# Patient Record
Sex: Female | Born: 1975 | Race: White | Hispanic: No | Marital: Married | Smoking: Never smoker
Health system: Southern US, Community
[De-identification: ages and names within clinical notes are randomized; demographics above are authoritative.]

## PROBLEM LIST (undated history)

## (undated) DIAGNOSIS — Z8614 Personal history of Methicillin resistant Staphylococcus aureus infection: Secondary | ICD-10-CM

## (undated) DIAGNOSIS — R630 Anorexia: Secondary | ICD-10-CM

## (undated) HISTORY — DX: Personal history of Methicillin resistant Staphylococcus aureus infection: Z86.14

## (undated) HISTORY — DX: Anorexia: R63.0

---

## 2009-02-23 DIAGNOSIS — M810 Age-related osteoporosis without current pathological fracture: Secondary | ICD-10-CM | POA: Insufficient documentation

## 2010-03-10 ENCOUNTER — Ambulatory Visit: Payer: Self-pay | Admitting: Obstetrics & Gynecology

## 2010-03-12 ENCOUNTER — Ambulatory Visit: Payer: Self-pay | Admitting: Family

## 2010-03-12 ENCOUNTER — Encounter: Payer: Self-pay | Admitting: Obstetrics and Gynecology

## 2010-03-12 ENCOUNTER — Ambulatory Visit (HOSPITAL_BASED_OUTPATIENT_CLINIC_OR_DEPARTMENT_OTHER): Admission: RE | Admit: 2010-03-12 | Discharge: 2010-03-12 | Payer: Self-pay | Admitting: Obstetrics & Gynecology

## 2010-03-12 ENCOUNTER — Ambulatory Visit: Payer: Self-pay | Admitting: Diagnostic Radiology

## 2010-03-12 LAB — CONVERTED CEMR LAB: hCG, Beta Chain, Quant, S: 18437 milliintl units/mL

## 2010-03-15 ENCOUNTER — Encounter: Payer: Self-pay | Admitting: Obstetrics and Gynecology

## 2010-03-15 LAB — CONVERTED CEMR LAB: hCG, Beta Chain, Quant, S: 22002 milliintl units/mL

## 2010-03-22 ENCOUNTER — Encounter: Payer: Self-pay | Admitting: Obstetrics and Gynecology

## 2010-03-22 ENCOUNTER — Ambulatory Visit: Payer: Self-pay | Admitting: Diagnostic Radiology

## 2010-03-22 ENCOUNTER — Ambulatory Visit (HOSPITAL_BASED_OUTPATIENT_CLINIC_OR_DEPARTMENT_OTHER): Admission: RE | Admit: 2010-03-22 | Discharge: 2010-03-22 | Payer: Self-pay | Admitting: Obstetrics & Gynecology

## 2010-03-22 LAB — CONVERTED CEMR LAB
Platelets: 207 10*3/uL (ref 150–400)
Rh Type: POSITIVE
WBC: 7.1 10*3/uL (ref 4.0–10.5)
hCG, Beta Chain, Quant, S: 36850.3 milliintl units/mL

## 2010-04-12 ENCOUNTER — Ambulatory Visit: Payer: Self-pay | Admitting: Diagnostic Radiology

## 2010-04-12 ENCOUNTER — Ambulatory Visit (HOSPITAL_BASED_OUTPATIENT_CLINIC_OR_DEPARTMENT_OTHER): Admission: RE | Admit: 2010-04-12 | Discharge: 2010-04-12 | Payer: Self-pay | Admitting: Obstetrics & Gynecology

## 2010-04-13 ENCOUNTER — Encounter: Payer: Self-pay | Admitting: Obstetrics and Gynecology

## 2010-04-26 ENCOUNTER — Encounter: Payer: Self-pay | Admitting: Obstetrics and Gynecology

## 2010-04-26 LAB — CONVERTED CEMR LAB: hCG, Beta Chain, Quant, S: 408.1 milliintl units/mL

## 2010-06-19 ENCOUNTER — Encounter: Payer: Self-pay | Admitting: Obstetrics & Gynecology

## 2010-08-04 ENCOUNTER — Encounter: Payer: Self-pay | Admitting: Obstetrics & Gynecology

## 2010-08-04 ENCOUNTER — Other Ambulatory Visit: Payer: Self-pay | Admitting: Obstetrics & Gynecology

## 2010-08-04 ENCOUNTER — Ambulatory Visit (INDEPENDENT_AMBULATORY_CARE_PROVIDER_SITE_OTHER): Payer: PRIVATE HEALTH INSURANCE | Admitting: Obstetrics & Gynecology

## 2010-08-04 DIAGNOSIS — N939 Abnormal uterine and vaginal bleeding, unspecified: Secondary | ICD-10-CM

## 2010-08-04 DIAGNOSIS — N926 Irregular menstruation, unspecified: Secondary | ICD-10-CM

## 2010-08-04 LAB — CONVERTED CEMR LAB: TSH: 2.181 microintl units/mL (ref 0.350–4.500)

## 2010-08-20 NOTE — Assessment & Plan Note (Signed)
Erika Garza, Erika Garza              ACCOUNT NO.:  [id number]  MEDICAL RECORD NO.:  [id number]           PATIENT TYPE:  LOCATION:  [facility]           FACILITY:  PHYSICIAN:  Elsie Lincoln, MD      DATE OF BIRTH:  01/18/1976  DATE OF SERVICE:  08/04/2010                                 CLINIC NOTE  The patient is a 35 year old female who presents for her uterine bleeding.  The patient had a diagnosis of a missed abortion back in November.  She took Cytotec, but never really passed the fetus after the Cytotec.  She then chose expectant management.  She had a positive pregnancy test in January and had continuous random bleeding.  The bleeding has continued and gotten worse through February.  It is unclear whether she has ever really passed the first pregnancy and got pregnant again or this is a continuation of bleeding from the first pregnancy. We will never know the answer to this question, but the patient does not seem concerned with this.  We just need to figure out why she is bleeding.  The patient has never really had documented normal periods, but she did not have problems getting pregnant.  She has had a history of anorexia, and did drop her periods at some point during her life, but she was on birth control for most part, and did bleed when she withdrew from the placebo pills.  She is in need of a Pap smear.  We will do that when she is no longer bleeding.  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  SURGICAL HISTORY:  C-section.  OBSTETRICAL HISTORY:  C-section and VBAC.  FAMILY HISTORY:  Hypothyroidism, hypertension, bladder cancer, and her paternal grandmother with depression.  The patient's medical history is also significant for osteoporosis.  PHYSICAL EXAMINATION:  VITAL SIGNS:  Pulse 86, blood pressure 123/89, weight 109, height is 66 inches. GENERAL:  Well nourished, well developed, in no  apparent distress, slightly underweight. ABDOMEN:  Soft, nontender. GENITALIA:  Tanner V with stains of blood.  Vagina pink, normal rugae with products of conception at the edge of os hanging into the vagina. These were easily removed.  Os is fingertip to 1, internal os is closed, external os is fingertip to 1, internal os is closed.  Uterus is mobile. Uterus is midline.  Uterus is slightly tender to deep palpation. Ultrasound shows a normal uterus with a thin lining.  Both ovaries are normal with small follicles.  ASSESSMENT AND PLAN:  A 35 year old female with products of conception involved at the edge of the cervix.  These were removed. 1. EPT was negative today.  However, the patient probably just had     finally expelled all of her products of conception from her cervix.     Probable mild endometritis, we will treat with doxycycline 100 mg     p.o. b.i.d. 2. Check TSH given low weight and dry hair. 3. Continue birth control pills, have normal cycle for two periods and     retry. 4. The patient needs Pap smear.          ______________________________ Elsie Lincoln, MD  KL/MEDQ  D:  08/04/2010  T:  08/04/2010  Job:  [id number]

## 2010-09-14 ENCOUNTER — Ambulatory Visit (HOSPITAL_COMMUNITY)
Admission: RE | Admit: 2010-09-14 | Discharge: 2010-09-14 | Disposition: A | Discharge status: Home / Self Care | Payer: PRIVATE HEALTH INSURANCE | Source: Ambulatory Visit | Attending: Obstetrics & Gynecology | Admitting: Obstetrics & Gynecology

## 2010-09-14 ENCOUNTER — Other Ambulatory Visit: Payer: Self-pay | Admitting: Obstetrics & Gynecology

## 2010-09-14 ENCOUNTER — Ambulatory Visit: Payer: PRIVATE HEALTH INSURANCE | Admitting: Obstetrics & Gynecology

## 2010-09-14 DIAGNOSIS — R58 Hemorrhage, not elsewhere classified: Secondary | ICD-10-CM

## 2010-09-14 DIAGNOSIS — O039 Complete or unspecified spontaneous abortion without complication: Secondary | ICD-10-CM | POA: Insufficient documentation

## 2010-09-14 DIAGNOSIS — R52 Pain, unspecified: Secondary | ICD-10-CM

## 2010-09-14 DIAGNOSIS — O021 Missed abortion: Secondary | ICD-10-CM

## 2010-09-15 NOTE — Assessment & Plan Note (Signed)
[name], Garza              ACCOUNT NO.:  [id number]  MEDICAL RECORD NO.:  [id number]           PATIENT TYPE:  O  LOCATION:  [facility]          FACILITY:  [facility]  PHYSICIAN:  Elsie Lincoln, MD      DATE OF BIRTH:  [birth year]  DATE OF SERVICE:                                 CLINIC NOTE  The patient is a 35 year old female who presents for followup beta.  The patient had negative urine pregnancy test on August 04, 2010,  pulled products of conception of her os.  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  The patient became immediately pregnant with the positive pregnancy test on Good Friday which is September 03, 2010. Her quant were went from 51 on the 9th to 319 on the 11th, and now down to 209 on 17th.  She is having mild cramping, but no severe abdominal pain.  She has had a vaginal bleeding.  We discussed the possibility of ectopic, but probably this is just a miscarriage in progress.  They are getting a transvaginal ultrasound today at [facility] to make sure there is no ectopic.  The patient understands all risks and diagnosis.  Her husband is here.  He is a Neurology resident at [facility] and he is involved in a conversation.  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  However, I think that she should wait 2 months before she gets pregnant again, so hopefully she can have a good __________ uterine site before implantation.  I do not think we need to do workup right now to make sure there is no thrombophilia, although the patient believes she has had a thrombophilia work up in the past.  Her TSH was normal, which is good news.  ASSESSMENT AND PLAN:  This is a 36 year old female with abnormally rising beta and uterine bleeding. 1. Rule out ectopic or transvaginal ultrasound. 2. We will call the patient with results of the ultrasound. 3. We will follow up quants as  clinically indicated based on     ultrasound.          ______________________________ Elsie Lincoln, MD    KL/MEDQ  D:  09/14/2010  T:  09/15/2010  Job:  [id number]

## 2010-10-01 ENCOUNTER — Ambulatory Visit (INDEPENDENT_AMBULATORY_CARE_PROVIDER_SITE_OTHER): Payer: PRIVATE HEALTH INSURANCE

## 2010-10-01 DIAGNOSIS — Z23 Encounter for immunization: Secondary | ICD-10-CM

## 2010-10-12 NOTE — Assessment & Plan Note (Signed)
Erika Garza, Erika Garza              ACCOUNT NO.:  [id number]   MEDICAL RECORD NO.:  [id number]          PATIENT TYPE:  POB   LOCATION:  [facility]         FACILITY:  [facility]   PHYSICIAN:  Sid Falcon, CNM  DATE OF BIRTH:  10/23/1975   DATE OF SERVICE:                                  CLINIC NOTE   REASON FOR VISIT:  Early pregnancy.   HISTORY:  This is a 35 year old G3, P 2-0-0-2 who was first seen for  this pregnancy on September 16 at a family practice center in [place].  She states that she had an ultrasound and a quant at that time  and was told she was 4-[redacted] weeks gestational age and the Sharene Butters was 33.  She has had no bleeding or pain.  She was seen here 2 days ago and  ultrasound by Vernona Rieger revealed a fairly large but empty gestational sac.  Of note, the patient has no subjective symptoms of pregnancy.   ASSESSMENT AND PLAN:  Anembryonic pregnancy.  We discussed the  possibilities of offering her Cytotec depending with her quantitative  beta-HCG as today which she elects to have done and she seems to be  leaning toward expectant management.  However, we did discuss since she  is interested in becoming pregnant again as soon as possible that  Cytotec may be in her interest.  We answered all her questions about  Cytotec and she is concerned that it may have some effect on whether or  not she could have a trial of labor and had some other concerns which  were all addressed.  It is noted that her blood pressure was 132/91 at  her visit here 2 days ago, but today she is 127/74, so that is something  to keep an eye on.  So, she will let us know what her desire is  regarding expectant management or Cytotec following the result of her  quantitative beta-HCG.     ______________________________  Caren Griffins, CNM    ______________________________  Sid Falcon, CNM    DP/MEDQ  D:  03/12/2010  T:  03/12/2010  Job:  [id number]

## 2010-12-13 ENCOUNTER — Encounter: Payer: Self-pay | Admitting: *Deleted

## 2010-12-13 ENCOUNTER — Telehealth: Payer: Self-pay | Admitting: *Deleted

## 2010-12-13 ENCOUNTER — Other Ambulatory Visit: Payer: Self-pay | Admitting: Obstetrics & Gynecology

## 2010-12-13 ENCOUNTER — Other Ambulatory Visit: Payer: PRIVATE HEALTH INSURANCE

## 2010-12-13 DIAGNOSIS — O2621 Pregnancy care for patient with recurrent pregnancy loss, first trimester: Secondary | ICD-10-CM

## 2010-12-13 NOTE — Progress Notes (Signed)
Pt here stating she had a positive UPT t home and request BHCG due to her H/O SAB  LMP 11/13/10.  Lab drawn and will contact pt with results.

## 2010-12-13 NOTE — Telephone Encounter (Signed)
Spoke with pt and gave results of BHCG of 640.3.  LMP 11/10/10.  Have pt repeat lab in 48 hrs per protocol

## 2010-12-15 ENCOUNTER — Other Ambulatory Visit: Payer: Self-pay | Admitting: Obstetrics & Gynecology

## 2010-12-15 ENCOUNTER — Other Ambulatory Visit: Payer: PRIVATE HEALTH INSURANCE

## 2010-12-15 DIAGNOSIS — O0990 Supervision of high risk pregnancy, unspecified, unspecified trimester: Secondary | ICD-10-CM

## 2010-12-16 ENCOUNTER — Telehealth: Payer: Self-pay | Admitting: *Deleted

## 2010-12-16 ENCOUNTER — Other Ambulatory Visit: Payer: Self-pay | Admitting: Obstetrics & Gynecology

## 2010-12-16 DIAGNOSIS — O0990 Supervision of high risk pregnancy, unspecified, unspecified trimester: Secondary | ICD-10-CM

## 2010-12-16 DIAGNOSIS — O09299 Supervision of pregnancy with other poor reproductive or obstetric history, unspecified trimester: Secondary | ICD-10-CM

## 2010-12-16 NOTE — Telephone Encounter (Signed)
Pt notified of BHCG results of 1754.0.  Pt is scheduled for an vaginal ultrasound 01/03/11 per Dr Jearld Lesch for viability.

## 2011-01-03 ENCOUNTER — Encounter (HOSPITAL_BASED_OUTPATIENT_CLINIC_OR_DEPARTMENT_OTHER): Payer: Self-pay

## 2011-01-03 ENCOUNTER — Other Ambulatory Visit: Payer: Self-pay | Admitting: Obstetrics & Gynecology

## 2011-01-03 ENCOUNTER — Ambulatory Visit (HOSPITAL_BASED_OUTPATIENT_CLINIC_OR_DEPARTMENT_OTHER)
Admission: RE | Admit: 2011-01-03 | Discharge: 2011-01-03 | Disposition: A | Discharge status: Home / Self Care | Payer: PRIVATE HEALTH INSURANCE | Source: Ambulatory Visit | Attending: Obstetrics & Gynecology | Admitting: Obstetrics & Gynecology

## 2011-01-03 ENCOUNTER — Ambulatory Visit (INDEPENDENT_AMBULATORY_CARE_PROVIDER_SITE_OTHER)
Admission: RE | Admit: 2011-01-03 | Discharge: 2011-01-03 | Disposition: A | Discharge status: Home / Self Care | Payer: PRIVATE HEALTH INSURANCE | Source: Ambulatory Visit | Attending: Obstetrics & Gynecology | Admitting: Obstetrics & Gynecology

## 2011-01-03 ENCOUNTER — Other Ambulatory Visit (HOSPITAL_BASED_OUTPATIENT_CLINIC_OR_DEPARTMENT_OTHER): Payer: PRIVATE HEALTH INSURANCE

## 2011-01-03 DIAGNOSIS — O2620 Pregnancy care for patient with recurrent pregnancy loss, unspecified trimester: Secondary | ICD-10-CM | POA: Insufficient documentation

## 2011-01-03 DIAGNOSIS — O0990 Supervision of high risk pregnancy, unspecified, unspecified trimester: Secondary | ICD-10-CM

## 2011-01-03 DIAGNOSIS — O09299 Supervision of pregnancy with other poor reproductive or obstetric history, unspecified trimester: Secondary | ICD-10-CM | POA: Insufficient documentation

## 2011-01-06 ENCOUNTER — Encounter: Payer: Self-pay | Admitting: *Deleted

## 2011-01-17 ENCOUNTER — Ambulatory Visit (INDEPENDENT_AMBULATORY_CARE_PROVIDER_SITE_OTHER): Payer: PRIVATE HEALTH INSURANCE | Admitting: Physician Assistant

## 2011-01-17 VITALS — BP 108/69 | Temp 98.4°F | Wt 113.0 lb

## 2011-01-17 DIAGNOSIS — Z1272 Encounter for screening for malignant neoplasm of vagina: Secondary | ICD-10-CM

## 2011-01-17 DIAGNOSIS — Z3480 Encounter for supervision of other normal pregnancy, unspecified trimester: Secondary | ICD-10-CM

## 2011-01-17 NOTE — Progress Notes (Signed)
NOB intakre and exam

## 2011-01-18 LAB — OBSTETRIC PANEL
Basophils Absolute: 0 10*3/uL (ref 0.0–0.1)
Basophils Relative: 0 % (ref 0–1)
HCT: 40.9 % (ref 36.0–46.0)
Hemoglobin: 13.3 g/dL (ref 12.0–15.0)
Hepatitis B Surface Ag: NEGATIVE
Lymphocytes Relative: 27 % (ref 12–46)
MCHC: 32.5 g/dL (ref 30.0–36.0)
Monocytes Absolute: 0.5 10*3/uL (ref 0.1–1.0)
Monocytes Relative: 8 % (ref 3–12)
Neutro Abs: 4.2 10*3/uL (ref 1.7–7.7)
Neutrophils Relative %: 64 % (ref 43–77)
RBC: 4.56 MIL/uL (ref 3.87–5.11)
Rh Type: POSITIVE
Rubella: 207.5 IU/mL — ABNORMAL HIGH

## 2011-01-18 LAB — HIV ANTIBODY (ROUTINE TESTING W REFLEX): HIV: NONREACTIVE

## 2011-01-19 ENCOUNTER — Other Ambulatory Visit: Payer: Self-pay | Admitting: Obstetrics & Gynecology

## 2011-01-19 DIAGNOSIS — Z3682 Encounter for antenatal screening for nuchal translucency: Secondary | ICD-10-CM

## 2011-02-09 ENCOUNTER — Other Ambulatory Visit (HOSPITAL_COMMUNITY): Payer: PRIVATE HEALTH INSURANCE

## 2011-02-10 ENCOUNTER — Other Ambulatory Visit (HOSPITAL_COMMUNITY): Payer: PRIVATE HEALTH INSURANCE

## 2011-02-10 ENCOUNTER — Ambulatory Visit (HOSPITAL_COMMUNITY)
Admission: RE | Admit: 2011-02-10 | Discharge: 2011-02-10 | Disposition: A | Discharge status: Home / Self Care | Payer: PRIVATE HEALTH INSURANCE | Source: Ambulatory Visit | Attending: Obstetrics & Gynecology | Admitting: Obstetrics & Gynecology

## 2011-02-10 ENCOUNTER — Other Ambulatory Visit: Payer: Self-pay

## 2011-02-10 DIAGNOSIS — O09529 Supervision of elderly multigravida, unspecified trimester: Secondary | ICD-10-CM | POA: Insufficient documentation

## 2011-02-10 DIAGNOSIS — O3510X0 Maternal care for (suspected) chromosomal abnormality in fetus, unspecified, not applicable or unspecified: Secondary | ICD-10-CM | POA: Insufficient documentation

## 2011-02-10 DIAGNOSIS — O351XX0 Maternal care for (suspected) chromosomal abnormality in fetus, not applicable or unspecified: Secondary | ICD-10-CM | POA: Insufficient documentation

## 2011-02-10 DIAGNOSIS — Z3689 Encounter for other specified antenatal screening: Secondary | ICD-10-CM | POA: Insufficient documentation

## 2011-02-10 DIAGNOSIS — Z3682 Encounter for antenatal screening for nuchal translucency: Secondary | ICD-10-CM

## 2011-02-14 ENCOUNTER — Ambulatory Visit (INDEPENDENT_AMBULATORY_CARE_PROVIDER_SITE_OTHER): Payer: PRIVATE HEALTH INSURANCE | Admitting: Advanced Practice Midwife

## 2011-02-14 DIAGNOSIS — Z3480 Encounter for supervision of other normal pregnancy, unspecified trimester: Secondary | ICD-10-CM

## 2011-02-14 NOTE — Progress Notes (Signed)
p-74 

## 2011-02-14 NOTE — Progress Notes (Signed)
Had integrated screen with MFM, unsure about when 2nd trimester blood draw is scheduled. We will f/u with MFM and let pt know. No c/o today. Unable to doppler FHT, cardiac activity present by bedside u/s.

## 2011-02-14 NOTE — Patient Instructions (Signed)
Pregnancy - Second Trimester The second trimester is the period between 13 to 27 weeks of your pregnancy. It is important to follow your doctor's instructions. HOME CARE  Do not smoke.   Do not drink alcohol or use drugs.   Only take medicine the doctor tells you to take.   Take prenatal vitamins as directed. The vitamin should contain 1 milligram of folic acid.   Exercise.   Eat healthy foods. Eat regular, well-balanced meals.   You can have sex (intercourse) if there are no other problems with the pregnancy.   Do not use hot tubs, steam rooms, or saunas.   Wear a seat belt while driving.   Avoid raw meat, uncooked cheese, and litter boxes and soil used by cats.   Visit your dentist. Cleanings are okay.  GET HELP IF:  You have any concerns or worries during your pregnancy.  GET HELP RIGHT AWAY IF:  You have a temperature by mouth above 101.0, not controlled by medicine.   Fluid is coming from the vagina.   Blood is coming from the vagina. Light spotting is common, especially after sex (intercourse).   You have a bad smelling fluid (discharge) coming from the vagina. The fluid changes from clear to white.   You still feel sick to your stomach (nauseous).   You throw up (vomit) blood.   You loose or gain more than 2 pounds (0.9 kilograms) of weight over a weeks time, or as suggested by your doctor.   Your face, hands, feet, or legs get puffy (swell).   You get exposed to German measles and have never had them.   You get exposed to fifth disease or chicken pox.   You have belly (abdominal) pain.   You have a bad headache that will not go away.   You have watery poop (diarrhea), pain when you pee (urinate), or have shortness of breath.   You start to have problems seeing (blurry or double vision).   You fall, are in a car accident, or have any kind of trauma.   There is mental or physical violence at home.  MAKE SURE YOU:   Understand these instructions.     Will watch your condition.   Will get help right away if you are not doing well or get worse.  Document Released: 08/10/2009  ExitCare Patient Information 2011 [employer]. 

## 2011-03-21 ENCOUNTER — Ambulatory Visit (INDEPENDENT_AMBULATORY_CARE_PROVIDER_SITE_OTHER): Payer: PRIVATE HEALTH INSURANCE | Admitting: Physician Assistant

## 2011-03-21 VITALS — BP 128/84 | Temp 98.5°F | Ht 66.0 in | Wt 128.0 lb

## 2011-03-21 DIAGNOSIS — O09529 Supervision of elderly multigravida, unspecified trimester: Secondary | ICD-10-CM | POA: Insufficient documentation

## 2011-03-21 DIAGNOSIS — Z3480 Encounter for supervision of other normal pregnancy, unspecified trimester: Secondary | ICD-10-CM

## 2011-03-21 NOTE — Patient Instructions (Signed)
Pregnancy - Second Trimester The second trimester of pregnancy (3 to 6 months) is a period of rapid growth for you and your baby. At the end of the sixth month, your baby is about 9 inches long and weighs 1 1/2 pounds. You will begin to feel the baby move between 18 and 20 weeks of the pregnancy. This is called quickening. Weight gain is faster. A clear fluid (colostrum) may leak out of your breasts. You may feel small contractions of the womb (uterus). This is known as false labor or Braxton-Hicks contractions. This is like a practice for labor when the baby is ready to be born. Usually, the problems with morning sickness have usually passed by the end of your first trimester. Some women develop small dark blotches (called cholasma, mask of pregnancy) on their face that usually goes away after the baby is born. Exposure to the sun makes the blotches worse. Acne may also develop in some pregnant women and pregnant women who have acne, may find that it goes away. PRENATAL EXAMS  Blood work may continue to be done during prenatal exams. These tests are done to check on your health and the probable health of your baby. Blood work is used to follow your blood levels (hemoglobin). Anemia (low hemoglobin) is common during pregnancy. Iron and vitamins are given to help prevent this. You will also be checked for diabetes between 24 and 28 weeks of the pregnancy. Some of the previous blood tests may be repeated.   The size of the uterus is measured during each visit. This is to make sure that the baby is continuing to grow properly according to the dates of the pregnancy.   Your blood pressure is checked every prenatal visit. This is to make sure you are not getting toxemia.   Your urine is checked to make sure you do not have an infection, diabetes or protein in the urine.   Your weight is checked often to make sure gains are happening at the suggested rate. This is to ensure that both you and your baby are  growing normally.   Sometimes, an ultrasound is performed to confirm the proper growth and development of the baby. This is a test which bounces harmless sound waves off the baby so your caregiver can more accurately determine due dates.  Sometimes, a specialized test is done on the amniotic fluid surrounding the baby. This test is called an amniocentesis. The amniotic fluid is obtained by sticking a needle into the belly (abdomen). This is done to check the chromosomes in instances where there is a concern about possible genetic problems with the baby. It is also sometimes done near the end of pregnancy if an early delivery is required. In this case, it is done to help make sure the baby's lungs are mature enough for the baby to live outside of the womb. CHANGES OCCURING IN THE SECOND TRIMESTER OF PREGNANCY Your body goes through many changes during pregnancy. They vary from person to person. Talk to your caregiver about changes you notice that you are concerned about.  During the second trimester, you will likely have an increase in your appetite. It is normal to have cravings for certain foods. This varies from person to person and pregnancy to pregnancy.   Your lower abdomen will begin to bulge.   You may have to urinate more often because the uterus and baby are pressing on your bladder. It is also common to get more bladder infections during pregnancy (  pain with urination). You can help this by drinking lots of fluids and emptying your bladder before and after intercourse.   You may begin to get stretch marks on your hips, abdomen, and breasts. These are normal changes in the body during pregnancy. There are no exercises or medications to take that prevent this change.   You may begin to develop swollen and bulging veins (varicose veins) in your legs. Wearing support hose, elevating your feet for 15 minutes, 3 to 4 times a day and limiting salt in your diet helps lessen the problem.    Heartburn may develop as the uterus grows and pushes up against the stomach. Antacids recommended by your caregiver helps with this problem. Also, eating smaller meals 4 to 5 times a day helps.   Constipation can be treated with a stool softener or adding bulk to your diet. Drinking lots of fluids, vegetables, fruits, and whole grains are helpful.   Exercising is also helpful. If you have been very active up until your pregnancy, most of these activities can be continued during your pregnancy. If you have been less active, it is helpful to start an exercise program such as walking.   Hemorrhoids (varicose veins in the rectum) may develop at the end of the second trimester. Warm sitz baths and hemorrhoid cream recommended by your caregiver helps hemorrhoid problems.   Backaches may develop during this time of your pregnancy. Avoid heavy lifting, wear low heal shoes and practice good posture to help with backache problems.   Some pregnant women develop tingling and numbness of their hand and fingers because of swelling and tightening of ligaments in the wrist (carpel tunnel syndrome). This goes away after the baby is born.   As your breasts enlarge, you may have to get a bigger bra. Get a comfortable, cotton, support bra. Do not get a nursing bra until the last month of the pregnancy if you will be nursing the baby.   You may get a dark line from your belly button to the pubic area called the linea nigra.   You may develop rosy cheeks because of increase blood flow to the face.   You may develop spider looking lines of the face, neck, arms and chest. These go away after the baby is born.  HOME CARE INSTRUCTIONS   It is extremely important to avoid all smoking, herbs, alcohol, and unprescribed drugs during your pregnancy. These chemicals affect the formation and growth of the baby. Avoid these chemicals throughout the pregnancy to ensure the delivery of a healthy infant.   Most of your home  care instructions are the same as suggested for the first trimester of your pregnancy. Keep your caregiver's appointments. Follow your caregiver's instructions regarding medication use, exercise and diet.   During pregnancy, you are providing food for you and your baby. Continue to eat regular, well-balanced meals. Choose foods such as meat, fish, milk and other low fat dairy products, vegetables, fruits, and whole-grain breads and cereals. Your caregiver will tell you of the ideal weight gain.   A physical sexual relationship may be continued up until near the end of pregnancy if there are no other problems. Problems could include early (premature) leaking of amniotic fluid from the membranes, vaginal bleeding, abdominal pain, or other medical or pregnancy problems.   Exercise regularly if there are no restrictions. Check with your caregiver if you are unsure of the safety of some of your exercises. The greatest weight gain will occur in the   last 2 trimesters of pregnancy. Exercise will help you:   Control your weight.   Get you in shape for labor and delivery.   Lose weight after you have the baby.   Wear a good support or jogging bra for breast tenderness during pregnancy. This may help if worn during sleep. Pads or tissues may be used in the bra if you are leaking colostrum.   Do not use hot tubs, steam rooms or saunas throughout the pregnancy.   Wear your seat belt at all times when driving. This protects you and your baby if you are in an accident.   Avoid raw meat, uncooked cheese, cat litter boxes and soil used by cats. These carry germs that can cause birth defects in the baby.   The second trimester is also a good time to visit your dentist for your dental health if this has not been done yet. Getting your teeth cleaned is OK. Use a soft toothbrush. Brush gently during pregnancy.   It is easier to loose urine during pregnancy. Tightening up and strengthening the pelvic muscles will  help with this problem. Practice stopping your urination while you are going to the bathroom. These are the same muscles you need to strengthen. It is also the muscles you would use as if you were trying to stop from passing gas. You can practice tightening these muscles up 10 times a set and repeating this about 3 times per day. Once you know what muscles to tighten up, do not perform these exercises during urination. It is more likely to contribute to an infection by backing up the urine.   Ask for help if you have financial, counseling or nutritional needs during pregnancy. Your caregiver will be able to offer counseling for these needs as well as refer you for other special needs.   Your skin may become oily. If so, wash your face with mild soap, use non-greasy moisturizer and oil or cream based makeup.  MEDICATIONS AND DRUG USE IN PREGNANCY  Take prenatal vitamins as directed. The vitamin should contain 1 milligram of folic acid. Keep all vitamins out of reach of children. Only a couple vitamins or tablets containing iron may be fatal to a baby or young child when ingested.   Avoid use of all medications, including herbs, over-the-counter medications, not prescribed or suggested by your caregiver. Only take over-the-counter or prescription medicines for pain, discomfort, or fever as directed by your caregiver. Do not use aspirin.   Let your caregiver also know about herbs you may be using.   Alcohol is related to a number of birth defects. This includes fetal alcohol syndrome. All alcohol, in any form, should be avoided completely. Smoking will cause low birth rate and premature babies.   Street or illegal drugs are very harmful to the baby. They are absolutely forbidden. A baby born to an addicted mother will be addicted at birth. The baby will go through the same withdrawal an adult does.  SEEK MEDICAL CARE IF:  You have any concerns or worries during your pregnancy. It is better to call with  your questions if you feel they cannot wait, rather than worry about them. SEEK IMMEDIATE MEDICAL CARE IF:   An unexplained oral temperature above 102 F (38.9 C) develops, or as your caregiver suggests.   You have leaking of fluid from the vagina (birth canal). If leaking membranes are suspected, take your temperature and tell your caregiver of this when you call.   There   is vaginal spotting, bleeding, or passing clots. Tell your caregiver of the amount and how many pads are used. Light spotting in pregnancy is common, especially following intercourse.   You develop a bad smelling vaginal discharge with a change in the color from clear to white.   You continue to feel sick to your stomach (nauseated) and have no relief from remedies suggested. You vomit blood or coffee ground-like materials.   You lose more than 2 pounds of weight or gain more than 2 pounds of weight over 1 week, or as suggested by your caregiver.   You notice swelling of your face, hands, feet, or legs.   You get exposed to German measles and have never had them.   You are exposed to fifth disease or chickenpox.   You develop belly (abdominal) pain. Round ligament discomfort is a common non-cancerous (benign) cause of abdominal pain in pregnancy. Your caregiver still must evaluate you.   You develop a bad headache that does not go away.   You develop fever, diarrhea, pain with urination, or shortness of breath.   You develop visual problems, blurry, or double vision.   You fall or are in a car accident or any kind of trauma.   There is mental or physical violence at home.  Document Released: 05/10/2001 Document Revised: 01/26/2011 Document Reviewed: 11/12/2008 Exit[employer] Patient Information 2012 [employer]. 

## 2011-03-21 NOTE — Progress Notes (Signed)
p-93 

## 2011-03-21 NOTE — Progress Notes (Signed)
+   FM, Anat scan scheduled with MFM. Comport measures for RLP, anticipatory guidance

## 2011-03-24 ENCOUNTER — Ambulatory Visit (HOSPITAL_COMMUNITY)
Admission: RE | Admit: 2011-03-24 | Discharge: 2011-03-24 | Disposition: A | Discharge status: Home / Self Care | Payer: PRIVATE HEALTH INSURANCE | Source: Ambulatory Visit | Attending: Physician Assistant | Admitting: Physician Assistant

## 2011-03-24 DIAGNOSIS — O09529 Supervision of elderly multigravida, unspecified trimester: Secondary | ICD-10-CM | POA: Insufficient documentation

## 2011-03-24 DIAGNOSIS — Z363 Encounter for antenatal screening for malformations: Secondary | ICD-10-CM | POA: Insufficient documentation

## 2011-03-24 DIAGNOSIS — Z1389 Encounter for screening for other disorder: Secondary | ICD-10-CM | POA: Insufficient documentation

## 2011-03-24 DIAGNOSIS — O358XX0 Maternal care for other (suspected) fetal abnormality and damage, not applicable or unspecified: Secondary | ICD-10-CM | POA: Insufficient documentation

## 2011-04-19 ENCOUNTER — Ambulatory Visit (INDEPENDENT_AMBULATORY_CARE_PROVIDER_SITE_OTHER): Payer: PRIVATE HEALTH INSURANCE | Admitting: Obstetrics & Gynecology

## 2011-04-19 VITALS — BP 130/75 | Wt 137.0 lb

## 2011-04-19 DIAGNOSIS — O09529 Supervision of elderly multigravida, unspecified trimester: Secondary | ICD-10-CM

## 2011-04-19 DIAGNOSIS — Z3480 Encounter for supervision of other normal pregnancy, unspecified trimester: Secondary | ICD-10-CM

## 2011-04-19 NOTE — Progress Notes (Signed)
Normal anatomy scan. No complaints.  Fetal movement and preterm labor precautions reviewed. Return to clinic in  4 weeks, will get 1 hr GTT and third trimester labs.

## 2011-04-19 NOTE — Progress Notes (Signed)
p-80 

## 2011-04-19 NOTE — Patient Instructions (Signed)
Pregnancy - Third Trimester The third trimester of pregnancy (the last 3 months) is a period of the most rapid growth for you and your baby. The baby approaches a length of 20 inches and a weight of 6 to 10 pounds. The baby is adding on fat and getting ready for life outside your body. While inside, babies have periods of sleeping and waking, suck their thumbs, and hiccups. You can often feel small contractions of the uterus. This is false labor. It is also called Braxton-Hicks contractions. This is like a practice for labor. The usual problems in this stage of pregnancy include more difficulty breathing, swelling of the hands and feet from water retention, and having to urinate more often because of the uterus and baby pressing on your bladder.  PRENATAL EXAMS  Blood work may continue to be done during prenatal exams. These tests are done to check on your health and the probable health of your baby. Blood work is used to follow your blood levels (hemoglobin). Anemia (low hemoglobin) is common during pregnancy. Iron and vitamins are given to help prevent this. You may also continue to be checked for diabetes. Some of the past blood tests may be done again.   The size of the uterus is measured during each visit. This makes sure your baby is growing properly according to your pregnancy dates.   Your blood pressure is checked every prenatal visit. This is to make sure you are not getting toxemia.   Your urine is checked every prenatal visit for infection, diabetes and protein.   Your weight is checked at each visit. This is done to make sure gains are happening at the suggested rate and that you and your baby are growing normally.   Sometimes, an ultrasound is performed to confirm the position and the proper growth and development of the baby. This is a test done that bounces harmless sound waves off the baby so your caregiver can more accurately determine due dates.   Discuss the type of pain  medication and anesthesia you will have during your labor and delivery.   Discuss the possibility and anesthesia if a Cesarean Section might be necessary.   Inform your caregiver if there is any mental or physical violence at home.  Sometimes, a specialized non-stress test, contraction stress test and biophysical profile are done to make sure the baby is not having a problem. Checking the amniotic fluid surrounding the baby is called an amniocentesis. The amniotic fluid is removed by sticking a needle into the belly (abdomen). This is sometimes done near the end of pregnancy if an early delivery is required. In this case, it is done to help make sure the baby's lungs are mature enough for the baby to live outside of the womb. If the lungs are not mature and it is unsafe to deliver the baby, an injection of cortisone medication is given to the mother 1 to 2 days before the delivery. This helps the baby's lungs mature and makes it safer to deliver the baby. CHANGES OCCURING IN THE THIRD TRIMESTER OF PREGNANCY Your body goes through many changes during pregnancy. They vary from person to person. Talk to your caregiver about changes you notice and are concerned about.  During the last trimester, you have probably had an increase in your appetite. It is normal to have cravings for certain foods. This varies from person to person and pregnancy to pregnancy.   You may begin to get stretch marks on your hips,   abdomen, and breasts. These are normal changes in the body during pregnancy. There are no exercises or medications to take which prevent this change.   Constipation may be treated with a stool softener or adding bulk to your diet. Drinking lots of fluids, fiber in vegetables, fruits, and whole grains are helpful.   Exercising is also helpful. If you have been very active up until your pregnancy, most of these activities can be continued during your pregnancy. If you have been less active, it is helpful  to start an exercise program such as walking. Consult your caregiver before starting exercise programs.   Avoid all smoking, alcohol, un-prescribed drugs, herbs and "street drugs" during your pregnancy. These chemicals affect the formation and growth of the baby. Avoid chemicals throughout the pregnancy to ensure the delivery of a healthy infant.   Backache, varicose veins and hemorrhoids may develop or get worse.   You will tire more easily in the third trimester, which is normal.   The baby's movements may be stronger and more often.   You may become short of breath easily.   Your belly button may stick out.   A yellow discharge may leak from your breasts called colostrum.   You may have a bloody mucus discharge. This usually occurs a few days to a week before labor begins.  HOME CARE INSTRUCTIONS   Keep your caregiver's appointments. Follow your caregiver's instructions regarding medication use, exercise, and diet.   During pregnancy, you are providing food for you and your baby. Continue to eat regular, well-balanced meals. Choose foods such as meat, fish, milk and other low fat dairy products, vegetables, fruits, and whole-grain breads and cereals. Your caregiver will tell you of the ideal weight gain.   A physical sexual relationship may be continued throughout pregnancy if there are no other problems such as early (premature) leaking of amniotic fluid from the membranes, vaginal bleeding, or belly (abdominal) pain.   Exercise regularly if there are no restrictions. Check with your caregiver if you are unsure of the safety of your exercises. Greater weight gain will occur in the last 2 trimesters of pregnancy. Exercising helps:   Control your weight.   Get you in shape for labor and delivery.   You lose weight after you deliver.   Rest a lot with legs elevated, or as needed for leg cramps or low back pain.   Wear a good support or jogging bra for breast tenderness during  pregnancy. This may help if worn during sleep. Pads or tissues may be used in the bra if you are leaking colostrum.   Do not use hot tubs, steam rooms, or saunas.   Wear your seat belt when driving. This protects you and your baby if you are in an accident.   Avoid raw meat, cat litter boxes and soil used by cats. These carry germs that can cause birth defects in the baby.   It is easier to loose urine during pregnancy. Tightening up and strengthening the pelvic muscles will help with this problem. You can practice stopping your urination while you are going to the bathroom. These are the same muscles you need to strengthen. It is also the muscles you would use if you were trying to stop from passing gas. You can practice tightening these muscles up 10 times a set and repeating this about 3 times per day. Once you know what muscles to tighten up, do not perform these exercises during urination. It is more likely   to cause an infection by backing up the urine.   Ask for help if you have financial, counseling or nutritional needs during pregnancy. Your caregiver will be able to offer counseling for these needs as well as refer you for other special needs.   Make a list of emergency phone numbers and have them available.   Plan on getting help from family or friends when you go home from the hospital.   Make a trial run to the hospital.   Take prenatal classes with the father to understand, practice and ask questions about the labor and delivery.   Prepare the baby's room/nursery.   Do not travel out of the city unless it is absolutely necessary and with the advice of your caregiver.   Wear only low or no heal shoes to have better balance and prevent falling.  MEDICATIONS AND DRUG USE IN PREGNANCY  Take prenatal vitamins as directed. The vitamin should contain 1 milligram of folic acid. Keep all vitamins out of reach of children. Only a couple vitamins or tablets containing iron may be fatal  to a baby or young child when ingested.   Avoid use of all medications, including herbs, over-the-counter medications, not prescribed or suggested by your caregiver. Only take over-the-counter or prescription medicines for pain, discomfort, or fever as directed by your caregiver. Do not use aspirin, ibuprofen (Motrin, Advil, Nuprin) or naproxen (Aleve) unless OK'd by your caregiver.   Let your caregiver also know about herbs you may be using.   Alcohol is related to a number of birth defects. This includes fetal alcohol syndrome. All alcohol, in any form, should be avoided completely. Smoking will cause low birth rate and premature babies.   Street/illegal drugs are very harmful to the baby. They are absolutely forbidden. A baby born to an addicted mother will be addicted at birth. The baby will go through the same withdrawal an adult does.  SEEK MEDICAL CARE IF: You have any concerns or worries during your pregnancy. It is better to call with your questions if you feel they cannot wait, rather than worry about them. DECISIONS ABOUT CIRCUMCISION You may or may not know the sex of your baby. If you know your baby is a boy, it may be time to think about circumcision. Circumcision is the removal of the foreskin of the penis. This is the skin that covers the sensitive end of the penis. There is no proven medical need for this. Often this decision is made on what is popular at the time or based upon religious beliefs and social issues. You can discuss these issues with your caregiver or pediatrician. SEEK IMMEDIATE MEDICAL CARE IF:   An unexplained oral temperature above 102 F (38.9 C) develops, or as your caregiver suggests.   You have leaking of fluid from the vagina (birth canal). If leaking membranes are suspected, take your temperature and tell your caregiver of this when you call.   There is vaginal spotting, bleeding or passing clots. Tell your caregiver of the amount and how many pads are  used.   You develop a bad smelling vaginal discharge with a change in the color from clear to white.   You develop vomiting that lasts more than 24 hours.   You develop chills or fever.   You develop shortness of breath.   You develop burning on urination.   You loose more than 2 pounds of weight or gain more than 2 pounds of weight or as suggested by your   caregiver.   You notice sudden swelling of your face, hands, and feet or legs.   You develop belly (abdominal) pain. Round ligament discomfort is a common non-cancerous (benign) cause of abdominal pain in pregnancy. Your caregiver still must evaluate you.   You develop a severe headache that does not go away.   You develop visual problems, blurred or double vision.   If you have not felt your baby move for more than 1 hour. If you think the baby is not moving as much as usual, eat something with sugar in it and lie down on your left side for an hour. The baby should move at least 4 to 5 times per hour. Call right away if your baby moves less than that.   You fall, are in a car accident or any kind of trauma.   There is mental or physical violence at home.  Document Released: 05/10/2001 Document Revised: 01/26/2011 Document Reviewed: 11/12/2008 [employer] Patient Information 2012 [employer]. Breastfeeding BENEFITS OF BREASTFEEDING For the baby  The first milk (colostrum) helps the baby's digestive system function better.   There are antibodies from the mother in the milk that help the baby fight off infections.   The baby has a lower incidence of asthma, allergies, and SIDS (sudden infant death syndrome).   The nutrients in breast milk are better than formulas for the baby and helps the baby's brain grow better.   Babies who breastfeed have less gas, colic, and constipation.  For the mother  Breastfeeding helps develop a very special bond between mother and baby.   It is more convenient, always available at the  correct temperature and cheaper than formula feeding.   It burns calories in the mother and helps with losing weight that was gained during pregnancy.   It makes the uterus contract back down to normal size faster and slows bleeding following delivery.   Breastfeeding mothers have a lower risk of developing breast cancer.  NURSE FREQUENTLY  A healthy, full-term baby may breastfeed as often as every hour or space his or her feedings to every 3 hours.   How often to nurse will vary from baby to baby. Watch your baby for signs of hunger, not the clock.   Nurse as often as the baby requests, or when you feel the need to reduce the fullness of your breasts.   Awaken the baby if it has been 3 to 4 hours since the last feeding.   Frequent feeding will help the mother make more milk and will prevent problems like sore nipples and engorgement of the breasts.  BABY'S POSITION AT THE BREAST  Whether lying down or sitting, be sure that the baby's tummy is facing your tummy.   Support the breast with 4 fingers underneath the breast and the thumb above. Make sure your fingers are well away from the nipple and baby's mouth.   Stroke the baby's lips and cheek closest to the breast gently with your finger or nipple.   When the baby's mouth is open wide enough, place all of your nipple and as much of the dark area around the nipple as possible into your baby's mouth.   Pull the baby in close so the tip of the nose and the baby's cheeks touch the breast during the feeding.  FEEDINGS  The length of each feeding varies from baby to baby and from feeding to feeding.   The baby must suck about 2 to 3 minutes for your milk   to get to him or her. This is called a "let down." For this reason, allow the baby to feed on each breast as long as he or she wants. Your baby will end the feeding when he or she has received the right balance of nutrients.   To break the suction, put your finger into the corner of the  baby's mouth and slide it between his or her gums before removing your breast from his or her mouth. This will help prevent sore nipples.  REDUCING BREAST ENGORGEMENT  In the first week after your baby is born, you may experience signs of breast engorgement. When breasts are engorged, they feel heavy, warm, full, and may be tender to the touch. You can reduce engorgement if you:   Nurse frequently, every 2 to 3 hours. Mothers who breastfeed early and often have fewer problems with engorgement.   Place light ice packs on your breasts between feedings. This reduces swelling. Wrap the ice packs in a lightweight towel to protect your skin.   Apply moist hot packs to your breast for 5 to 10 minutes before each feeding. This increases circulation and helps the milk flow.   Gently massage your breast before and during the feeding.   Make sure that the baby empties at least one breast at every feeding before switching sides.   Use a breast pump to empty the breasts if your baby is sleepy or not nursing well. You may also want to pump if you are returning to work or or you feel you are getting engorged.   Avoid bottle feeds, pacifiers or supplemental feedings of water or juice in place of breastfeeding.   Be sure the baby is latched on and positioned properly while breastfeeding.   Prevent fatigue, stress, and anemia.   Wear a supportive bra, avoiding underwire styles.   Eat a balanced diet with enough fluids.  If you follow these suggestions, your engorgement should improve in 24 to 48 hours. If you are still experiencing difficulty, call your lactation consultant or caregiver. IS MY BABY GETTING ENOUGH MILK? Sometimes, mothers worry about whether their babies are getting enough milk. You can be assured that your baby is getting enough milk if:  The baby is actively sucking and you hear swallowing.   The baby nurses at least 8 to 12 times in a 24 hour time period. Nurse your baby until he or  she unlatches or falls asleep at the first breast (at least 10 to 20 minutes), then offer the second side.   The baby is wetting 5 to 6 disposable diapers (6 to 8 cloth diapers) in a 24 hour period by 5 to 6 days of age.   The baby is having at least 2 to 3 stools every 24 hours for the first few months. Breast milk is all the food your baby needs. It is not necessary for your baby to have water or formula. In fact, to help your breasts make more milk, it is best not to give your baby supplemental feedings during the early weeks.   The stool should be soft and yellow.   The baby should gain 4 to 7 ounces per week after he is 4 days old.  TAKE CARE OF YOURSELF Take care of your breasts by:  Bathing or showering daily.   Avoiding the use of soaps on your nipples.   Start feedings on your left breast at one feeding and on your right breast at the next feeding.     You will notice an increase in your milk supply 2 to 5 days after delivery. You may feel some discomfort from engorgement, which makes your breasts very firm and often tender. Engorgement "peaks" out within 24 to 48 hours. In the meantime, apply warm moist towels to your breasts for 5 to 10 minutes before feeding. Gentle massage and expression of some milk before feeding will soften your breasts, making it easier for your baby to latch on. Wear a well fitting nursing bra and air dry your nipples for 10 to 15 minutes after each feeding.   Only use cotton bra pads.   Only use pure lanolin on your nipples after nursing. You do not need to wash it off before nursing.  Take care of yourself by:   Eating well-balanced meals and nutritious snacks.   Drinking milk, fruit juice, and water to satisfy your thirst (about 8 glasses a day).   Getting plenty of rest.   Increasing calcium in your diet (1200 mg a day).   Avoiding foods that you notice affect the baby in a bad way.  SEEK MEDICAL CARE IF:   You have any questions or difficulty  with breastfeeding.   You need help.   You have a hard, red, sore area on your breast, accompanied by a fever of 100.5 F (38.1 C) or more.   Your baby is too sleepy to eat well or is having trouble sleeping.   Your baby is wetting less than 6 diapers per day, by 5 days of age.   Your baby's skin or white part of his or her eyes is more yellow than it was in the hospital.   You feel depressed.  Document Released: 05/16/2005 Document Revised: 01/26/2011 Document Reviewed: 12/29/2008 [employer] Patient Information 2012 [employer]. 

## 2011-05-18 ENCOUNTER — Ambulatory Visit (INDEPENDENT_AMBULATORY_CARE_PROVIDER_SITE_OTHER): Payer: PRIVATE HEALTH INSURANCE | Admitting: Obstetrics & Gynecology

## 2011-05-18 VITALS — BP 119/79 | Temp 97.6°F | Wt 141.0 lb

## 2011-05-18 DIAGNOSIS — Z3480 Encounter for supervision of other normal pregnancy, unspecified trimester: Secondary | ICD-10-CM

## 2011-05-18 NOTE — Progress Notes (Signed)
GCT today.  Had a viral illness and saw primary care.  No abx taken.

## 2011-05-18 NOTE — Progress Notes (Signed)
[id number]=[phone] 

## 2011-05-18 NOTE — Patient Instructions (Signed)
Breastfeeding BENEFITS OF BREASTFEEDING For the baby  The first milk (colostrum) helps the baby's digestive system function better.   There are antibodies from the mother in the milk that help the baby fight off infections.   The baby has a lower incidence of asthma, allergies, and SIDS (sudden infant death syndrome).   The nutrients in breast milk are better than formulas for the baby and helps the baby's brain grow better.   Babies who breastfeed have less gas, colic, and constipation.  For the mother  Breastfeeding helps develop a very special bond between mother and baby.   It is more convenient, always available at the correct temperature and cheaper than formula feeding.   It burns calories in the mother and helps with losing weight that was gained during pregnancy.   It makes the uterus contract back down to normal size faster and slows bleeding following delivery.   Breastfeeding mothers have a lower risk of developing breast cancer.  NURSE FREQUENTLY  A healthy, full-term baby may breastfeed as often as every hour or space his or her feedings to every 3 hours.   How often to nurse will vary from baby to baby. Watch your baby for signs of hunger, not the clock.   Nurse as often as the baby requests, or when you feel the need to reduce the fullness of your breasts.   Awaken the baby if it has been 3 to 4 hours since the last feeding.   Frequent feeding will help the mother make more milk and will prevent problems like sore nipples and engorgement of the breasts.  BABY'S POSITION AT THE BREAST  Whether lying down or sitting, be sure that the baby's tummy is facing your tummy.   Support the breast with 4 fingers underneath the breast and the thumb above. Make sure your fingers are well away from the nipple and baby's mouth.   Stroke the baby's lips and cheek closest to the breast gently with your finger or nipple.   When the baby's mouth is open wide enough, place  all of your nipple and as much of the dark area around the nipple as possible into your baby's mouth.   Pull the baby in close so the tip of the nose and the baby's cheeks touch the breast during the feeding.  FEEDINGS  The length of each feeding varies from baby to baby and from feeding to feeding.   The baby must suck about 2 to 3 minutes for your milk to get to him or her. This is called a "let down." For this reason, allow the baby to feed on each breast as long as he or she wants. Your baby will end the feeding when he or she has received the right balance of nutrients.   To break the suction, put your finger into the corner of the baby's mouth and slide it between his or her gums before removing your breast from his or her mouth. This will help prevent sore nipples.  REDUCING BREAST ENGORGEMENT  In the first week after your baby is born, you may experience signs of breast engorgement. When breasts are engorged, they feel heavy, warm, full, and may be tender to the touch. You can reduce engorgement if you:   Nurse frequently, every 2 to 3 hours. Mothers who breastfeed early and often have fewer problems with engorgement.   Place light ice packs on your breasts between feedings. This reduces swelling. Wrap the ice packs in a   lightweight towel to protect your skin.   Apply moist hot packs to your breast for 5 to 10 minutes before each feeding. This increases circulation and helps the milk flow.   Gently massage your breast before and during the feeding.   Make sure that the baby empties at least one breast at every feeding before switching sides.   Use a breast pump to empty the breasts if your baby is sleepy or not nursing well. You may also want to pump if you are returning to work or or you feel you are getting engorged.   Avoid bottle feeds, pacifiers or supplemental feedings of water or juice in place of breastfeeding.   Be sure the baby is latched on and positioned properly while  breastfeeding.   Prevent fatigue, stress, and anemia.   Wear a supportive bra, avoiding underwire styles.   Eat a balanced diet with enough fluids.  If you follow these suggestions, your engorgement should improve in 24 to 48 hours. If you are still experiencing difficulty, call your lactation consultant or caregiver. IS MY BABY GETTING ENOUGH MILK? Sometimes, mothers worry about whether their babies are getting enough milk. You can be assured that your baby is getting enough milk if:  The baby is actively sucking and you hear swallowing.   The baby nurses at least 8 to 12 times in a 24 hour time period. Nurse your baby until he or she unlatches or falls asleep at the first breast (at least 10 to 20 minutes), then offer the second side.   The baby is wetting 5 to 6 disposable diapers (6 to 8 cloth diapers) in a 24 hour period by 5 to 6 days of age.   The baby is having at least 2 to 3 stools every 24 hours for the first few months. Breast milk is all the food your baby needs. It is not necessary for your baby to have water or formula. In fact, to help your breasts make more milk, it is best not to give your baby supplemental feedings during the early weeks.   The stool should be soft and yellow.   The baby should gain 4 to 7 ounces per week after he is 4 days old.  TAKE CARE OF YOURSELF Take care of your breasts by:  Bathing or showering daily.   Avoiding the use of soaps on your nipples.   Start feedings on your left breast at one feeding and on your right breast at the next feeding.   You will notice an increase in your milk supply 2 to 5 days after delivery. You may feel some discomfort from engorgement, which makes your breasts very firm and often tender. Engorgement "peaks" out within 24 to 48 hours. In the meantime, apply warm moist towels to your breasts for 5 to 10 minutes before feeding. Gentle massage and expression of some milk before feeding will soften your breasts, making  it easier for your baby to latch on. Wear a well fitting nursing bra and air dry your nipples for 10 to 15 minutes after each feeding.   Only use cotton bra pads.   Only use pure lanolin on your nipples after nursing. You do not need to wash it off before nursing.  Take care of yourself by:   Eating well-balanced meals and nutritious snacks.   Drinking milk, fruit juice, and water to satisfy your thirst (about 8 glasses a day).   Getting plenty of rest.   Increasing calcium in   your diet (1200 mg a day).   Avoiding foods that you notice affect the baby in a bad way.  SEEK MEDICAL CARE IF:   You have any questions or difficulty with breastfeeding.   You need help.   You have a hard, red, sore area on your breast, accompanied by a fever of 100.5 F (38.1 C) or more.   Your baby is too sleepy to eat well or is having trouble sleeping.   Your baby is wetting less than 6 diapers per day, by 5 days of age.   Your baby's skin or white part of his or her eyes is more yellow than it was in the hospital.   You feel depressed.  Document Released: 05/16/2005 Document Revised: 01/26/2011 Document Reviewed: 12/29/2008 [employer] Patient Information 2012 [employer]. 

## 2011-05-19 LAB — CBC
Hemoglobin: 11.9 g/dL — ABNORMAL LOW (ref 12.0–15.0)
MCHC: 31.9 g/dL (ref 30.0–36.0)
RDW: 13.5 % (ref 11.5–15.5)
WBC: 8.6 10*3/uL (ref 4.0–10.5)

## 2011-05-19 LAB — RPR

## 2011-05-19 LAB — GLUCOSE TOLERANCE, 1 HOUR: Glucose, 1 Hour GTT: 89 mg/dL (ref 70–140)

## 2011-06-01 ENCOUNTER — Ambulatory Visit (INDEPENDENT_AMBULATORY_CARE_PROVIDER_SITE_OTHER): Payer: PRIVATE HEALTH INSURANCE | Admitting: Obstetrics & Gynecology

## 2011-06-01 DIAGNOSIS — Z3480 Encounter for supervision of other normal pregnancy, unspecified trimester: Secondary | ICD-10-CM

## 2011-06-01 DIAGNOSIS — O09529 Supervision of elderly multigravida, unspecified trimester: Secondary | ICD-10-CM

## 2011-06-01 NOTE — Patient Instructions (Signed)
Breastfeeding BENEFITS OF BREASTFEEDING For the baby  The first milk (colostrum) helps the baby's digestive system function better.   There are antibodies from the mother in the milk that help the baby fight off infections.   The baby has a lower incidence of asthma, allergies, and SIDS (sudden infant death syndrome).   The nutrients in breast milk are better than formulas for the baby and helps the baby's brain grow better.   Babies who breastfeed have less gas, colic, and constipation.  For the mother  Breastfeeding helps develop a very special bond between mother and baby.   It is more convenient, always available at the correct temperature and cheaper than formula feeding.   It burns calories in the mother and helps with losing weight that was gained during pregnancy.   It makes the uterus contract back down to normal size faster and slows bleeding following delivery.   Breastfeeding mothers have a lower risk of developing breast cancer.  NURSE FREQUENTLY  A healthy, full-term baby may breastfeed as often as every hour or space his or her feedings to every 3 hours.   How often to nurse will vary from baby to baby. Watch your baby for signs of hunger, not the clock.   Nurse as often as the baby requests, or when you feel the need to reduce the fullness of your breasts.   Awaken the baby if it has been 3 to 4 hours since the last feeding.   Frequent feeding will help the mother make more milk and will prevent problems like sore nipples and engorgement of the breasts.  BABY'S POSITION AT THE BREAST  Whether lying down or sitting, be sure that the baby's tummy is facing your tummy.   Support the breast with 4 fingers underneath the breast and the thumb above. Make sure your fingers are well away from the nipple and baby's mouth.   Stroke the baby's lips and cheek closest to the breast gently with your finger or nipple.   When the baby's mouth is open wide enough, place all  of your nipple and as much of the dark area around the nipple as possible into your baby's mouth.   Pull the baby in close so the tip of the nose and the baby's cheeks touch the breast during the feeding.  FEEDINGS  The length of each feeding varies from baby to baby and from feeding to feeding.   The baby must suck about 2 to 3 minutes for your milk to get to him or her. This is called a "let down." For this reason, allow the baby to feed on each breast as long as he or she wants. Your baby will end the feeding when he or she has received the right balance of nutrients.   To break the suction, put your finger into the corner of the baby's mouth and slide it between his or her gums before removing your breast from his or her mouth. This will help prevent sore nipples.  REDUCING BREAST ENGORGEMENT  In the first week after your baby is born, you may experience signs of breast engorgement. When breasts are engorged, they feel heavy, warm, full, and may be tender to the touch. You can reduce engorgement if you:   Nurse frequently, every 2 to 3 hours. Mothers who breastfeed early and often have fewer problems with engorgement.   Place light ice packs on your breasts between feedings. This reduces swelling. Wrap the ice packs in a   lightweight towel to protect your skin.   Apply moist hot packs to your breast for 5 to 10 minutes before each feeding. This increases circulation and helps the milk flow.   Gently massage your breast before and during the feeding.   Make sure that the baby empties at least one breast at every feeding before switching sides.   Use a breast pump to empty the breasts if your baby is sleepy or not nursing well. You may also want to pump if you are returning to work or or you feel you are getting engorged.   Avoid bottle feeds, pacifiers or supplemental feedings of water or juice in place of breastfeeding.   Be sure the baby is latched on and positioned properly while  breastfeeding.   Prevent fatigue, stress, and anemia.   Wear a supportive bra, avoiding underwire styles.   Eat a balanced diet with enough fluids.  If you follow these suggestions, your engorgement should improve in 24 to 48 hours. If you are still experiencing difficulty, call your lactation consultant or caregiver. IS MY BABY GETTING ENOUGH MILK? Sometimes, mothers worry about whether their babies are getting enough milk. You can be assured that your baby is getting enough milk if:  The baby is actively sucking and you hear swallowing.   The baby nurses at least 8 to 12 times in a 24 hour time period. Nurse your baby until he or she unlatches or falls asleep at the first breast (at least 10 to 20 minutes), then offer the second side.   The baby is wetting 5 to 6 disposable diapers (6 to 8 cloth diapers) in a 24 hour period by 5 to 6 days of age.   The baby is having at least 2 to 3 stools every 24 hours for the first few months. Breast milk is all the food your baby needs. It is not necessary for your baby to have water or formula. In fact, to help your breasts make more milk, it is best not to give your baby supplemental feedings during the early weeks.   The stool should be soft and yellow.   The baby should gain 4 to 7 ounces per week after he is 4 days old.  TAKE CARE OF YOURSELF Take care of your breasts by:  Bathing or showering daily.   Avoiding the use of soaps on your nipples.   Start feedings on your left breast at one feeding and on your right breast at the next feeding.   You will notice an increase in your milk supply 2 to 5 days after delivery. You may feel some discomfort from engorgement, which makes your breasts very firm and often tender. Engorgement "peaks" out within 24 to 48 hours. In the meantime, apply warm moist towels to your breasts for 5 to 10 minutes before feeding. Gentle massage and expression of some milk before feeding will soften your breasts, making  it easier for your baby to latch on. Wear a well fitting nursing bra and air dry your nipples for 10 to 15 minutes after each feeding.   Only use cotton bra pads.   Only use pure lanolin on your nipples after nursing. You do not need to wash it off before nursing.  Take care of yourself by:   Eating well-balanced meals and nutritious snacks.   Drinking milk, fruit juice, and water to satisfy your thirst (about 8 glasses a day).   Getting plenty of rest.   Increasing calcium in   your diet (1200 mg a day).   Avoiding foods that you notice affect the baby in a bad way.  SEEK MEDICAL CARE IF:   You have any questions or difficulty with breastfeeding.   You need help.   You have a hard, red, sore area on your breast, accompanied by a fever of 100.5 F (38.1 C) or more.   Your baby is too sleepy to eat well or is having trouble sleeping.   Your baby is wetting less than 6 diapers per day, by 58 days of age.   Your baby's skin or white part of his or her eyes is more yellow than it was in the hospital.   You feel depressed.  Document Released: 05/16/2005 Document Revised: 01/26/2011 Document Reviewed: 12/29/2008 [facility] Patient Information 2012 [place], [place].Vaginal Birth After Cesarean Delivery Vaginal birth after Cesarean delivery (VBAC) is giving birth vaginally after previously delivering a baby by a cesarean. In the past, if a woman had a Cesarean delivery, all births afterwards would be done by Cesarean delivery. This is no longer true. It can be safe for the mother to try a vaginal delivery after having a Cesarean. The final decision to have a VBAC or repeat Cesarean delivery should be between the patient and her caregiver. The risks and benefits can be discussed relative to the reason for, and the type of the previous Cesarean delivery. WOMEN WHO PLAN TO HAVE A VBAC SHOULD CHECK WITH THEIR DOCTOR TO BE SURE THAT:  The previous Cesarean was done with a low transverse uterine  incision (not a vertical classical incision).   The birth canal is big enough for the baby.   There were no other operations on the uterus.   They will have an electronic fetal monitor (EFM) on at all times during labor.   An operating room would be available and ready in case an emergency Cesarean is needed.   A doctor and surgical nursing staff would be available at all times during labor to be ready to do an emergency Cesarean if necessary.   An anesthesiologist would be present in case an emergency Cesarean is needed.   The nursery is prepared and has adequate personnel and necessary equipment available to care for the baby in case of an emergency Cesarean.  BENEFITS OF VBAC:  Shorter stay in the hospital.   Lower delivery, nursery and hospital costs.   Less blood loss and need for blood transfusions.   Less fever and discomfort from major surgery.   Lower risk of blood clots.   Lower risk of infection.   Shorter recovery after going home.   Lower risk of other surgical complications, such as opening of the incision or hernia in the incision.   Decreased risk of injury to other organs.   Decreased risk for having to remove the uterus (hysterectomy).   Decreased risk for the placenta to completely or partially cover the opening of the uterus (placenta previa) with a future pregnancy.   Ability to have a larger family if desired.  RISKS OF A VBAC:  Rupture of the uterus.   Having to remove the uterus (hysterectomy) if it ruptures.   All the complications of major surgery and/or injury to other organs.   Excessive bleeding, blood clots and infection.   Lower Apgar scores (method to evaluate the newborn based on appearance, pulse, grimace, activity, and respiration) and more risks to the baby.   There is a higher risk of uterine rupture if you induce  or augment labor.   There is a higher risk of uterine rupture if you use medications to ripen the cervix.  VBAC  SHOULD NOT BE DONE IF:  The previous Cesarean was done with a vertical (classical) or T-shaped incision, or you do not know what kind of an incision was made.   You had a ruptured uterus.   You had surgery on your uterus.   You have medical or obstetrical problems.   There are problems with the baby.   There were two previous Cesarean deliveries and no vaginal deliveries.  OTHER FACTS TO KNOW ABOUT VBAC:  It is safe to have an epidural anesthetic with VBAC.   It is safe to turn the baby from a breech position (attempt an external cephalic version).   It is safe to try a VBAC with twins.   Pregnancies later than 40 weeks have not been successful with VBAC.   There is an increased failure rate of a VBAC in obese pregnant women.   There is an increased failure rate with VABC if the baby weighs 8.8 pounds (4000 grams) or more.   There is an increased failure rate if the time between the Cesarean and VBAC is less than 19 months.   There is an increased failure rate if pre-eclampsia is present (high blood pressure, protein in the urine and swelling of face and extremities).   VBAC is very successful if there was a previous vaginal birth.   VBAC is very successful when the labor starts spontaneously before the due date.   Delivery of VBAC is similar to having a normal spontaneous vaginal delivery.  It is important to discuss VBAC with your caregiver early in the pregnancy so you can understand the risks, benefits and options. It will give you time to decide what is best in your particular case relevant to the reason for your previous Cesarean delivery. It should be understood that medical changes in the mother or pregnancy may occur during the pregnancy, which make it necessary to change you or your caregiver's initial decision. The counseling, concerns and decisions should be documented in the medical record and signed by all parties. Document Released: 11/06/2006 Document Revised:  01/26/2011 Document Reviewed: 06/27/2008 [facility] Patient Information 2012 [place], [place].

## 2011-06-01 NOTE — Progress Notes (Signed)
nml GCT.  Interested in wireless monitoring and water birth (labor in tub).

## 2011-06-01 NOTE — Progress Notes (Signed)
p=103 

## 2011-06-07 ENCOUNTER — Encounter: Payer: PRIVATE HEALTH INSURANCE | Admitting: Obstetrics & Gynecology

## 2011-06-21 ENCOUNTER — Ambulatory Visit (INDEPENDENT_AMBULATORY_CARE_PROVIDER_SITE_OTHER): Payer: PRIVATE HEALTH INSURANCE | Admitting: Obstetrics & Gynecology

## 2011-06-21 DIAGNOSIS — Z3480 Encounter for supervision of other normal pregnancy, unspecified trimester: Secondary | ICD-10-CM

## 2011-06-21 NOTE — Progress Notes (Signed)
Had episode of epigastric pain yesterday.  Vomited x1.  No pain today.  110/65 yesterday.  Ok to use malox or zantac.

## 2011-06-21 NOTE — Progress Notes (Signed)
p-86  Pt had episode of epigastric pain yesterday, thinks it might be gallbladder.

## 2011-07-06 ENCOUNTER — Encounter: Payer: Self-pay | Admitting: Obstetrics & Gynecology

## 2011-07-06 ENCOUNTER — Ambulatory Visit (INDEPENDENT_AMBULATORY_CARE_PROVIDER_SITE_OTHER): Payer: PRIVATE HEALTH INSURANCE | Admitting: Obstetrics & Gynecology

## 2011-07-06 DIAGNOSIS — Z3480 Encounter for supervision of other normal pregnancy, unspecified trimester: Secondary | ICD-10-CM

## 2011-07-06 NOTE — Progress Notes (Signed)
p=90 

## 2011-07-06 NOTE — Progress Notes (Signed)
No problems, Prog only pill. Breast feeding.  Water birth class is full for Feb.  Have phone call into [facility].

## 2011-07-06 NOTE — Patient Instructions (Signed)

## 2011-07-18 ENCOUNTER — Ambulatory Visit (INDEPENDENT_AMBULATORY_CARE_PROVIDER_SITE_OTHER): Payer: PRIVATE HEALTH INSURANCE | Admitting: Physician Assistant

## 2011-07-18 DIAGNOSIS — Z3480 Encounter for supervision of other normal pregnancy, unspecified trimester: Secondary | ICD-10-CM

## 2011-07-18 NOTE — Progress Notes (Signed)
p-99 

## 2011-07-18 NOTE — Patient Instructions (Signed)
preg third Pregnancy - Third Trimester The third trimester of pregnancy (the last 3 months) is a period of the most rapid growth for you and your baby. The baby approaches a length of 20 inches and a weight of 6 to 10 pounds. The baby is adding on fat and getting ready for life outside your body. While inside, babies have periods of sleeping and waking, suck their thumbs, and hiccups. You can often feel small contractions of the uterus. This is false labor. It is also called Braxton-Hicks contractions. This is like a practice for labor. The usual problems in this stage of pregnancy include more difficulty breathing, swelling of the hands and feet from water retention, and having to urinate more often because of the uterus and baby pressing on your bladder.  PRENATAL EXAMS  Blood work may continue to be done during prenatal exams. These tests are done to check on your health and the probable health of your baby. Blood work is used to follow your blood levels (hemoglobin). Anemia (low hemoglobin) is common during pregnancy. Iron and vitamins are given to help prevent this. You may also continue to be checked for diabetes. Some of the past blood tests may be done again.   The size of the uterus is measured during each visit. This makes sure your baby is growing properly according to your pregnancy dates.   Your blood pressure is checked every prenatal visit. This is to make sure you are not getting toxemia.   Your urine is checked every prenatal visit for infection, diabetes and protein.   Your weight is checked at each visit. This is done to make sure gains are happening at the suggested rate and that you and your baby are growing normally.   Sometimes, an ultrasound is performed to confirm the position and the proper growth and development of the baby. This is a test done that bounces harmless sound waves off the baby so your caregiver can more accurately determine due dates.   Discuss the type of  pain medication and anesthesia you will have during your labor and delivery.   Discuss the possibility and anesthesia if a Cesarean Section might be necessary.   Inform your caregiver if there is any mental or physical violence at home.  Sometimes, a specialized non-stress test, contraction stress test and biophysical profile are done to make sure the baby is not having a problem. Checking the amniotic fluid surrounding the baby is called an amniocentesis. The amniotic fluid is removed by sticking a needle into the belly (abdomen). This is sometimes done near the end of pregnancy if an early delivery is required. In this case, it is done to help make sure the baby's lungs are mature enough for the baby to live outside of the womb. If the lungs are not mature and it is unsafe to deliver the baby, an injection of cortisone medication is given to the mother 1 to 2 days before the delivery. This helps the baby's lungs mature and makes it safer to deliver the baby. CHANGES OCCURING IN THE THIRD TRIMESTER OF PREGNANCY Your body goes through many changes during pregnancy. They vary from person to person. Talk to your caregiver about changes you notice and are concerned about.  During the last trimester, you have probably had an increase in your appetite. It is normal to have cravings for certain foods. This varies from person to person and pregnancy to pregnancy.   You may begin to get stretch marks on  your hips, abdomen, and breasts. These are normal changes in the body during pregnancy. There are no exercises or medications to take which prevent this change.   Constipation may be treated with a stool softener or adding bulk to your diet. Drinking lots of fluids, fiber in vegetables, fruits, and whole grains are helpful.   Exercising is also helpful. If you have been very active up until your pregnancy, most of these activities can be continued during your pregnancy. If you have been less active, it is  helpful to start an exercise program such as walking. Consult your caregiver before starting exercise programs.   Avoid all smoking, alcohol, un-prescribed drugs, herbs and "street drugs" during your pregnancy. These chemicals affect the formation and growth of the baby. Avoid chemicals throughout the pregnancy to ensure the delivery of a healthy infant.   Backache, varicose veins and hemorrhoids may develop or get worse.   You will tire more easily in the third trimester, which is normal.   The baby's movements may be stronger and more often.   You may become short of breath easily.   Your belly button may stick out.   A yellow discharge may leak from your breasts called colostrum.   You may have a bloody mucus discharge. This usually occurs a few days to a week before labor begins.  HOME CARE INSTRUCTIONS   Keep your caregiver's appointments. Follow your caregiver's instructions regarding medication use, exercise, and diet.   During pregnancy, you are providing food for you and your baby. Continue to eat regular, well-balanced meals. Choose foods such as meat, fish, milk and other low fat dairy products, vegetables, fruits, and whole-grain breads and cereals. Your caregiver will tell you of the ideal weight gain.   A physical sexual relationship may be continued throughout pregnancy if there are no other problems such as early (premature) leaking of amniotic fluid from the membranes, vaginal bleeding, or belly (abdominal) pain.   Exercise regularly if there are no restrictions. Check with your caregiver if you are unsure of the safety of your exercises. Greater weight gain will occur in the last 2 trimesters of pregnancy. Exercising helps:   Control your weight.   Get you in shape for labor and delivery.   You lose weight after you deliver.   Rest a lot with legs elevated, or as needed for leg cramps or low back pain.   Wear a good support or jogging bra for breast tenderness  during pregnancy. This may help if worn during sleep. Pads or tissues may be used in the bra if you are leaking colostrum.   Do not use hot tubs, steam rooms, or saunas.   Wear your seat belt when driving. This protects you and your baby if you are in an accident.   Avoid raw meat, cat litter boxes and soil used by cats. These carry germs that can cause birth defects in the baby.   It is easier to loose urine during pregnancy. Tightening up and strengthening the pelvic muscles will help with this problem. You can practice stopping your urination while you are going to the bathroom. These are the same muscles you need to strengthen. It is also the muscles you would use if you were trying to stop from passing gas. You can practice tightening these muscles up 10 times a set and repeating this about 3 times per day. Once you know what muscles to tighten up, do not perform these exercises during urination. It is  more likely to cause an infection by backing up the urine.   Ask for help if you have financial, counseling or nutritional needs during pregnancy. Your caregiver will be able to offer counseling for these needs as well as refer you for other special needs.   Make a list of emergency phone numbers and have them available.   Plan on getting help from family or friends when you go home from the hospital.   Make a trial run to the hospital.   Take prenatal classes with the father to understand, practice and ask questions about the labor and delivery.   Prepare the baby's room/nursery.   Do not travel out of the city unless it is absolutely necessary and with the advice of your caregiver.   Wear only low or no heal shoes to have better balance and prevent falling.  MEDICATIONS AND DRUG USE IN PREGNANCY  Take prenatal vitamins as directed. The vitamin should contain 1 milligram of folic acid. Keep all vitamins out of reach of children. Only a couple vitamins or tablets containing iron may be  fatal to a baby or young child when ingested.   Avoid use of all medications, including herbs, over-the-counter medications, not prescribed or suggested by your caregiver. Only take over-the-counter or prescription medicines for pain, discomfort, or fever as directed by your caregiver. Do not use aspirin, ibuprofen (Motrin, Advil, Nuprin) or naproxen (Aleve) unless OK'd by your caregiver.   Let your caregiver also know about herbs you may be using.   Alcohol is related to a number of birth defects. This includes fetal alcohol syndrome. All alcohol, in any form, should be avoided completely. Smoking will cause low birth rate and premature babies.   Street/illegal drugs are very harmful to the baby. They are absolutely forbidden. A baby born to an addicted mother will be addicted at birth. The baby will go through the same withdrawal an adult does.  SEEK MEDICAL CARE IF: You have any concerns or worries during your pregnancy. It is better to call with your questions if you feel they cannot wait, rather than worry about them. DECISIONS ABOUT CIRCUMCISION You may or may not know the sex of your baby. If you know your baby is a boy, it may be time to think about circumcision. Circumcision is the removal of the foreskin of the penis. This is the skin that covers the sensitive end of the penis. There is no proven medical need for this. Often this decision is made on what is popular at the time or based upon religious beliefs and social issues. You can discuss these issues with your caregiver or pediatrician. SEEK IMMEDIATE MEDICAL CARE IF:   An unexplained oral temperature above 102 F (38.9 C) develops, or as your caregiver suggests.   You have leaking of fluid from the vagina (birth canal). If leaking membranes are suspected, take your temperature and tell your caregiver of this when you call.   There is vaginal spotting, bleeding or passing clots. Tell your caregiver of the amount and how many  pads are used.   You develop a bad smelling vaginal discharge with a change in the color from clear to white.   You develop vomiting that lasts more than 24 hours.   You develop chills or fever.   You develop shortness of breath.   You develop burning on urination.   You loose more than 2 pounds of weight or gain more than 2 pounds of weight or as suggested  by your caregiver.   You notice sudden swelling of your face, hands, and feet or legs.   You develop belly (abdominal) pain. Round ligament discomfort is a common non-cancerous (benign) cause of abdominal pain in pregnancy. Your caregiver still must evaluate you.   You develop a severe headache that does not go away.   You develop visual problems, blurred or double vision.   If you have not felt your baby move for more than 1 hour. If you think the baby is not moving as much as usual, eat something with sugar in it and lie down on your left side for an hour. The baby should move at least 4 to 5 times per hour. Call right away if your baby moves less than that.   You fall, are in a car accident or any kind of trauma.   There is mental or physical violence at home.  Document Released: 05/10/2001 Document Revised: 01/26/2011 Document Reviewed: 11/12/2008 [facility] Patient Information 2012 [place], [place].

## 2011-07-18 NOTE — Progress Notes (Signed)
No complaints. +FM. No s/s labor. Waterbirth class Wednesday.

## 2011-07-27 ENCOUNTER — Ambulatory Visit (INDEPENDENT_AMBULATORY_CARE_PROVIDER_SITE_OTHER): Payer: PRIVATE HEALTH INSURANCE | Admitting: Obstetrics & Gynecology

## 2011-07-27 VITALS — BP 129/84 | Temp 98.5°F | Wt 161.0 lb

## 2011-07-27 DIAGNOSIS — Z3480 Encounter for supervision of other normal pregnancy, unspecified trimester: Secondary | ICD-10-CM

## 2011-07-27 NOTE — Progress Notes (Signed)
Addended by: Granville Lewis on: 07/27/2011 11:21 AM   Modules accepted: Orders

## 2011-07-27 NOTE — Progress Notes (Signed)
Tight 1/60/-2.  No problems. Cultures done today.

## 2011-07-30 LAB — GC/CHLAMYDIA PROBE AMP, GENITAL
Chlamydia, DNA Probe: NEGATIVE
GC Probe Amp, Genital: NEGATIVE

## 2011-07-30 LAB — CULTURE, BETA STREP (GROUP B ONLY)

## 2011-08-02 ENCOUNTER — Ambulatory Visit (INDEPENDENT_AMBULATORY_CARE_PROVIDER_SITE_OTHER): Payer: PRIVATE HEALTH INSURANCE | Admitting: Obstetrics & Gynecology

## 2011-08-02 DIAGNOSIS — Z3480 Encounter for supervision of other normal pregnancy, unspecified trimester: Secondary | ICD-10-CM

## 2011-08-02 NOTE — Progress Notes (Signed)
p-89 

## 2011-08-02 NOTE — Progress Notes (Signed)
No problems.  Expectant mgmt.

## 2011-08-09 ENCOUNTER — Inpatient Hospital Stay (HOSPITAL_COMMUNITY)
Admission: AD | Admit: 2011-08-09 | Discharge: 2011-08-13 | DRG: 766 | Disposition: A | Discharge status: Home / Self Care | Payer: PRIVATE HEALTH INSURANCE | Source: Ambulatory Visit | Attending: Family Medicine | Admitting: Family Medicine

## 2011-08-09 ENCOUNTER — Encounter (HOSPITAL_COMMUNITY): Payer: Self-pay | Admitting: *Deleted

## 2011-08-09 DIAGNOSIS — O34219 Maternal care for unspecified type scar from previous cesarean delivery: Principal | ICD-10-CM | POA: Diagnosis present

## 2011-08-09 DIAGNOSIS — O09529 Supervision of elderly multigravida, unspecified trimester: Secondary | ICD-10-CM | POA: Diagnosis present

## 2011-08-09 LAB — CBC
HCT: 39 % (ref 36.0–46.0)
Hemoglobin: 13.1 g/dL (ref 12.0–15.0)
MCV: 88.8 fL (ref 78.0–100.0)
RDW: 13.8 % (ref 11.5–15.5)
WBC: 11.6 10*3/uL — ABNORMAL HIGH (ref 4.0–10.5)

## 2011-08-09 MED ORDER — FLEET ENEMA 7-19 GM/118ML RE ENEM: 1.0000 | ENEMA | RECTAL | Status: DC | PRN

## 2011-08-09 MED ORDER — ACETAMINOPHEN 325 MG PO TABS: 650.0000 mg | ORAL_TABLET | ORAL | Status: DC | PRN

## 2011-08-09 MED ORDER — CITRIC ACID-SODIUM CITRATE 334-500 MG/5ML PO SOLN
30.0000 mL | ORAL | Status: DC | PRN
  Administered 2011-08-10: 30 mL via ORAL
  Filled 2011-08-09 (×2): qty 15

## 2011-08-09 MED ORDER — LIDOCAINE HCL (PF) 1 % IJ SOLN: 30.0000 mL | INTRAMUSCULAR | Status: DC | PRN

## 2011-08-09 MED ORDER — ONDANSETRON HCL 4 MG/2ML IJ SOLN: 4.0000 mg | Freq: Four times a day (QID) | INTRAMUSCULAR | Status: DC | PRN

## 2011-08-09 MED ORDER — LACTATED RINGERS IV SOLN: INTRAVENOUS | Status: DC

## 2011-08-09 MED ORDER — IBUPROFEN 600 MG PO TABS: 600.0000 mg | ORAL_TABLET | Freq: Four times a day (QID) | ORAL | Status: DC | PRN

## 2011-08-09 MED ORDER — OXYTOCIN 20 UNITS IN LACTATED RINGERS INFUSION - SIMPLE: 125.0000 mL/h | Freq: Once | INTRAVENOUS | Status: DC

## 2011-08-09 MED ORDER — OXYTOCIN BOLUS FROM INFUSION
500.0000 mL | Freq: Once | INTRAVENOUS | Status: DC
  Filled 2011-08-09: qty 500

## 2011-08-09 MED ORDER — LACTATED RINGERS IV SOLN: 500.0000 mL | INTRAVENOUS | Status: DC | PRN

## 2011-08-09 MED ORDER — OXYCODONE-ACETAMINOPHEN 5-325 MG PO TABS: 1.0000 | ORAL_TABLET | ORAL | Status: DC | PRN

## 2011-08-09 NOTE — H&P (Signed)
Erika Garza is a 36 y.o. female [id number] at 72 wk 3 d presenting with contractions.  Denies leaking or bleeding.  Patient is planning a VBAC.  Patient has h/o cesarean section followed by a successful VBAC.  She has had essentially uneventful prenatal care at [facility].  Maternal Medical History:  Reason for admission: Reason for admission: contractions.  Contractions: Onset was 3-5 hours ago.   Frequency: regular.    Fetal activity: Perceived fetal activity is normal.   Last perceived fetal movement was within the past hour.    Prenatal complications: no prenatal complications   OB History    Grav Para Term Preterm Abortions TAB SAB Ect Mult Living   5 2 2  0 2 0 2 0 0 2     Past Medical History  Diagnosis Date  . Anorexia     H/O  . Osteoporosis 2009    Took Forteo between pregnancies   Past Surgical History  Procedure Date  . Cesarean section 6/07   Family History: family history includes Anorexia nervosa in her sister; Cancer in her paternal grandmother; Depression in her paternal grandmother; Hypertension in her brother and mother; and Hypothyroidism in her mother. Social History:  reports that she has never smoked. She has never used smokeless tobacco. She reports that she does not drink alcohol or use illicit drugs.  Review of Systems  Gastrointestinal: Positive for abdominal pain (uterine contractions).  All other systems reviewed and are negative.    Dilation: 4.5 Effacement (%): 50 Station: -3 Blood pressure 120/67, pulse 97, temperature 98.2 F (36.8 C), temperature source Oral, resp. rate 22, height 5\' 6"  (1.676 m), weight 73.993 kg (163 lb 2 oz), last menstrual period 11/13/2010, unknown if currently breastfeeding. Maternal Exam:  Uterine Assessment: CTX q 3 mins. Approx. 60 sec.  Cervix: Exam deferred at present, but 4.5/50/-3 in MAU  Fetal Exam Fetal Monitor Review: Baseline rate: 135.  Variability: moderate (6-25 bpm).   Pattern: accelerations  present and no decelerations.    Fetal State Assessment: Category I - tracings are normal.     Physical Exam  Constitutional: She is oriented to person, place, and time. She appears well-developed and well-nourished.  HENT:  Head: Normocephalic and atraumatic.  Neck: Neck supple.  Cardiovascular: Normal rate and regular rhythm.   Respiratory: Effort normal. No respiratory distress.  GI: She exhibits distension (Gravida).  Musculoskeletal: Normal range of motion.  Neurological: She is alert and oriented to person, place, and time.    Prenatal labs: ABO, Rh: A/POS/-- (08/20 1653) Antibody: NEG (08/20 1653) Rubella: 207.5 (08/20 1653) RPR: NON REAC (12/19 1506)  HBsAg: NEGATIVE (08/20 1653)  HIV: NON REACTIVE (12/19 1506)  GBS:  NEG (07/27/11)   Assessment: 36 y.o. female [id number] at 38 wk 3 d in early labor.  Plan: Provide intrapartum care while patient continues to labor.  Plan VBAC, but will continue to monitor patient for possible complications that would lead to repeat LTCS.  Mardene Speak 08/09/2011, 10:50 PM

## 2011-08-09 NOTE — MAU Note (Signed)
PT SAYS SHE   FEELS PRESSURE- TODAY MORE TWINGES- MAYBE HAVING UC-  BUT AT 5 PM - THINKS SHE IS IN EARLY LABOR.  PT WAS TOLD   HAD MRSA BECAUSE OF KNEE INFECTION  UNSURE WHEN - FROM AN URGENT CARE- CHART NOT FLAGGED.   DOES FEEL BABY MOVE.

## 2011-08-10 ENCOUNTER — Encounter (HOSPITAL_COMMUNITY)
Admission: AD | Disposition: A | Discharge status: Home / Self Care | Payer: Self-pay | Source: Ambulatory Visit | Attending: Family Medicine

## 2011-08-10 ENCOUNTER — Encounter: Payer: PRIVATE HEALTH INSURANCE | Admitting: Obstetrics & Gynecology

## 2011-08-10 ENCOUNTER — Encounter (HOSPITAL_COMMUNITY): Payer: Self-pay | Admitting: *Deleted

## 2011-08-10 ENCOUNTER — Encounter (HOSPITAL_COMMUNITY): Payer: Self-pay | Admitting: Anesthesiology

## 2011-08-10 DIAGNOSIS — O34219 Maternal care for unspecified type scar from previous cesarean delivery: Principal | ICD-10-CM

## 2011-08-10 DIAGNOSIS — O09529 Supervision of elderly multigravida, unspecified trimester: Secondary | ICD-10-CM

## 2011-08-10 LAB — CBC
HCT: 33.6 % — ABNORMAL LOW (ref 36.0–46.0)
Hemoglobin: 11.2 g/dL — ABNORMAL LOW (ref 12.0–15.0)
MCH: 29.6 pg (ref 26.0–34.0)
MCHC: 33.3 g/dL (ref 30.0–36.0)
MCV: 88.7 fL (ref 78.0–100.0)

## 2011-08-10 SURGERY — Surgical Case
Anesthesia: Regional | Site: Abdomen | Wound class: Clean Contaminated

## 2011-08-10 MED ORDER — ONDANSETRON HCL 4 MG/2ML IJ SOLN: 4.0000 mg | INTRAMUSCULAR | Status: DC | PRN

## 2011-08-10 MED ORDER — IBUPROFEN 600 MG PO TABS
600.0000 mg | ORAL_TABLET | Freq: Four times a day (QID) | ORAL | Status: DC
  Administered 2011-08-10 – 2011-08-13 (×12): 600 mg via ORAL
  Filled 2011-08-10 (×4): qty 1

## 2011-08-10 MED ORDER — ONDANSETRON HCL 4 MG/2ML IJ SOLN: 4.0000 mg | Freq: Three times a day (TID) | INTRAMUSCULAR | Status: DC | PRN

## 2011-08-10 MED ORDER — SCOPOLAMINE 1 MG/3DAYS TD PT72
MEDICATED_PATCH | TRANSDERMAL | Status: AC
  Filled 2011-08-10: qty 1

## 2011-08-10 MED ORDER — OXYTOCIN 20 UNITS IN LACTATED RINGERS INFUSION - SIMPLE
INTRAVENOUS | Status: AC
  Filled 2011-08-10: qty 1000

## 2011-08-10 MED ORDER — 0.9 % SODIUM CHLORIDE (POUR BTL) OPTIME
TOPICAL | Status: DC | PRN
  Administered 2011-08-10: 1000 mL

## 2011-08-10 MED ORDER — SIMETHICONE 80 MG PO CHEW
80.0000 mg | CHEWABLE_TABLET | ORAL | Status: DC | PRN
Start: 2011-08-10 — End: 2011-08-13

## 2011-08-10 MED ORDER — SCOPOLAMINE 1 MG/3DAYS TD PT72
1.0000 | MEDICATED_PATCH | Freq: Once | TRANSDERMAL | Status: AC
  Administered 2011-08-10: 1.5 mg via TRANSDERMAL

## 2011-08-10 MED ORDER — IBUPROFEN 600 MG PO TABS
600.0000 mg | ORAL_TABLET | Freq: Four times a day (QID) | ORAL | Status: DC | PRN
  Administered 2011-08-13: 600 mg via ORAL
  Filled 2011-08-10 (×9): qty 1

## 2011-08-10 MED ORDER — MEASLES, MUMPS & RUBELLA VAC ~~LOC~~ INJ
0.5000 mL | INJECTION | Freq: Once | SUBCUTANEOUS | Status: DC
  Filled 2011-08-10: qty 0.5

## 2011-08-10 MED ORDER — METOCLOPRAMIDE HCL 5 MG/ML IJ SOLN: 10.0000 mg | Freq: Three times a day (TID) | INTRAMUSCULAR | Status: DC | PRN

## 2011-08-10 MED ORDER — WITCH HAZEL-GLYCERIN EX PADS: 1.0000 "application " | MEDICATED_PAD | CUTANEOUS | Status: DC | PRN

## 2011-08-10 MED ORDER — KETOROLAC TROMETHAMINE 60 MG/2ML IM SOLN
INTRAMUSCULAR | Status: AC
  Filled 2011-08-10: qty 2

## 2011-08-10 MED ORDER — DIPHENHYDRAMINE HCL 50 MG/ML IJ SOLN: 25.0000 mg | INTRAMUSCULAR | Status: DC | PRN

## 2011-08-10 MED ORDER — MENTHOL 3 MG MT LOZG: 1.0000 | LOZENGE | OROMUCOSAL | Status: DC | PRN

## 2011-08-10 MED ORDER — SODIUM CHLORIDE 0.9 % IJ SOLN
3.0000 mL | INTRAMUSCULAR | Status: DC | PRN
  Administered 2011-08-10: 3 mL via INTRAVENOUS

## 2011-08-10 MED ORDER — MICROFIBRILLAR COLL HEMOSTAT EX POWD
CUTANEOUS | Status: DC | PRN
  Administered 2011-08-10: 1 g via TOPICAL

## 2011-08-10 MED ORDER — KETOROLAC TROMETHAMINE 60 MG/2ML IM SOLN
60.0000 mg | Freq: Once | INTRAMUSCULAR | Status: AC | PRN
  Administered 2011-08-10: 60 mg via INTRAMUSCULAR

## 2011-08-10 MED ORDER — OXYCODONE-ACETAMINOPHEN 5-325 MG PO TABS
1.0000 | ORAL_TABLET | ORAL | Status: DC | PRN
  Administered 2011-08-11 – 2011-08-13 (×5): 1 via ORAL
  Filled 2011-08-10 (×5): qty 1

## 2011-08-10 MED ORDER — DIBUCAINE 1 % RE OINT: 1.0000 "application " | TOPICAL_OINTMENT | RECTAL | Status: DC | PRN

## 2011-08-10 MED ORDER — ZOLPIDEM TARTRATE 5 MG PO TABS
5.0000 mg | ORAL_TABLET | Freq: Every evening | ORAL | Status: DC | PRN
Start: 2011-08-10 — End: 2011-08-13

## 2011-08-10 MED ORDER — HYDROMORPHONE HCL PF 1 MG/ML IJ SOLN: 0.2500 mg | INTRAMUSCULAR | Status: DC | PRN

## 2011-08-10 MED ORDER — SODIUM CHLORIDE 0.9 % IV SOLN
1.0000 ug/kg/h | INTRAVENOUS | Status: DC | PRN
  Filled 2011-08-10: qty 2.5

## 2011-08-10 MED ORDER — FENTANYL CITRATE 0.05 MG/ML IJ SOLN
INTRAMUSCULAR | Status: AC
  Filled 2011-08-10: qty 2

## 2011-08-10 MED ORDER — SIMETHICONE 80 MG PO CHEW
80.0000 mg | CHEWABLE_TABLET | Freq: Three times a day (TID) | ORAL | Status: DC
  Administered 2011-08-10 – 2011-08-13 (×11): 80 mg via ORAL

## 2011-08-10 MED ORDER — SENNOSIDES-DOCUSATE SODIUM 8.6-50 MG PO TABS
2.0000 | ORAL_TABLET | Freq: Every day | ORAL | Status: DC
  Administered 2011-08-10 – 2011-08-12 (×3): 2 via ORAL

## 2011-08-10 MED ORDER — DIPHENHYDRAMINE HCL 25 MG PO CAPS: 25.0000 mg | ORAL_CAPSULE | Freq: Four times a day (QID) | ORAL | Status: DC | PRN

## 2011-08-10 MED ORDER — BUPIVACAINE HCL (PF) 0.25 % IJ SOLN
INTRAMUSCULAR | Status: DC | PRN
  Administered 2011-08-10: 30 mL

## 2011-08-10 MED ORDER — MEPERIDINE HCL 25 MG/ML IJ SOLN
INTRAMUSCULAR | Status: AC
  Filled 2011-08-10: qty 1

## 2011-08-10 MED ORDER — MICROFIBRILLAR COLL HEMOSTAT EX POWD
CUTANEOUS | Status: AC
  Filled 2011-08-10: qty 5

## 2011-08-10 MED ORDER — KETOROLAC TROMETHAMINE 30 MG/ML IJ SOLN: 30.0000 mg | Freq: Four times a day (QID) | INTRAMUSCULAR | Status: AC | PRN

## 2011-08-10 MED ORDER — BUPIVACAINE HCL (PF) 0.25 % IJ SOLN
INTRAMUSCULAR | Status: AC
  Filled 2011-08-10: qty 30

## 2011-08-10 MED ORDER — NALBUPHINE SYRINGE 5 MG/0.5 ML
5.0000 mg | INJECTION | INTRAMUSCULAR | Status: DC | PRN
  Filled 2011-08-10: qty 1

## 2011-08-10 MED ORDER — NALOXONE HCL 0.4 MG/ML IJ SOLN: 0.4000 mg | INTRAMUSCULAR | Status: DC | PRN

## 2011-08-10 MED ORDER — DIPHENHYDRAMINE HCL 50 MG/ML IJ SOLN
12.5000 mg | INTRAMUSCULAR | Status: DC | PRN
Start: 2011-08-10 — End: 2011-08-13

## 2011-08-10 MED ORDER — MEPERIDINE HCL 25 MG/ML IJ SOLN: 6.2500 mg | INTRAMUSCULAR | Status: DC | PRN

## 2011-08-10 MED ORDER — PRENATAL MULTIVITAMIN CH
1.0000 | ORAL_TABLET | Freq: Every day | ORAL | Status: DC
  Administered 2011-08-10 – 2011-08-13 (×4): 1 via ORAL
  Filled 2011-08-10 (×4): qty 1

## 2011-08-10 MED ORDER — TETANUS-DIPHTH-ACELL PERTUSSIS 5-2.5-18.5 LF-MCG/0.5 IM SUSP: 0.5000 mL | Freq: Once | INTRAMUSCULAR | Status: DC

## 2011-08-10 MED ORDER — LANOLIN HYDROUS EX OINT: 1.0000 "application " | TOPICAL_OINTMENT | CUTANEOUS | Status: DC | PRN

## 2011-08-10 MED ORDER — OXYTOCIN 20 UNITS IN LACTATED RINGERS INFUSION - SIMPLE
125.0000 mL/h | INTRAVENOUS | Status: AC
  Administered 2011-08-10: 125 mL/h via INTRAVENOUS

## 2011-08-10 MED ORDER — DEXTROSE IN LACTATED RINGERS 5 % IV SOLN: INTRAVENOUS | Status: DC

## 2011-08-10 MED ORDER — OXYTOCIN 10 UNIT/ML IJ SOLN
INTRAMUSCULAR | Status: AC
  Filled 2011-08-10: qty 4

## 2011-08-10 MED ORDER — ONDANSETRON HCL 4 MG PO TABS: 4.0000 mg | ORAL_TABLET | ORAL | Status: DC | PRN

## 2011-08-10 MED ORDER — DIPHENHYDRAMINE HCL 25 MG PO CAPS: 25.0000 mg | ORAL_CAPSULE | ORAL | Status: DC | PRN

## 2011-08-10 MED ORDER — MORPHINE SULFATE 0.5 MG/ML IJ SOLN
INTRAMUSCULAR | Status: AC
  Filled 2011-08-10: qty 10

## 2011-08-10 SURGICAL SUPPLY — 30 items
BENZOIN TINCTURE PRP APPL 2/3 (GAUZE/BANDAGES/DRESSINGS) ×2 IMPLANT
CHLORAPREP W/TINT 26ML (MISCELLANEOUS) ×2 IMPLANT
CLOTH BEACON ORANGE TIMEOUT ST (SAFETY) ×2 IMPLANT
DRESSING TELFA 8X3 (GAUZE/BANDAGES/DRESSINGS) ×2 IMPLANT
ELECT REM PT RETURN 9FT ADLT (ELECTROSURGICAL) ×2
ELECTRODE REM PT RTRN 9FT ADLT (ELECTROSURGICAL) ×1 IMPLANT
EXTRACTOR VACUUM M CUP 4 TUBE (SUCTIONS) IMPLANT
GAUZE SPONGE 4X4 12PLY STRL LF (GAUZE/BANDAGES/DRESSINGS) ×4 IMPLANT
GLOVE BIOGEL PI IND STRL 7.0 (GLOVE) ×1 IMPLANT
GLOVE BIOGEL PI INDICATOR 7.0 (GLOVE) ×1
GLOVE ECLIPSE 7.0 STRL STRAW (GLOVE) ×4 IMPLANT
GOWN PREVENTION PLUS LG XLONG (DISPOSABLE) ×4 IMPLANT
GOWN PREVENTION PLUS XLARGE (GOWN DISPOSABLE) ×2 IMPLANT
KIT ABG SYR 3ML LUER SLIP (SYRINGE) ×2 IMPLANT
NEEDLE HYPO 22GX1.5 SAFETY (NEEDLE) ×2 IMPLANT
NEEDLE HYPO 25X5/8 SAFETYGLIDE (NEEDLE) ×2 IMPLANT
NS IRRIG 1000ML POUR BTL (IV SOLUTION) ×2 IMPLANT
PACK C SECTION WH (CUSTOM PROCEDURE TRAY) ×2 IMPLANT
PAD ABD 7.5X8 STRL (GAUZE/BANDAGES/DRESSINGS) ×2 IMPLANT
RTRCTR C-SECT PINK 25CM LRG (MISCELLANEOUS) ×2 IMPLANT
SLEEVE SCD COMPRESS KNEE MED (MISCELLANEOUS) ×2 IMPLANT
STAPLER VISISTAT 35W (STAPLE) IMPLANT
STRIP CLOSURE SKIN 1/2X4 (GAUZE/BANDAGES/DRESSINGS) ×2 IMPLANT
SUT VIC AB 0 CTX 36 (SUTURE) ×3
SUT VIC AB 0 CTX36XBRD ANBCTRL (SUTURE) ×3 IMPLANT
SUT VIC AB 4-0 KS 27 (SUTURE) ×2 IMPLANT
SYR 30ML LL (SYRINGE) ×2 IMPLANT
TOWEL OR 17X24 6PK STRL BLUE (TOWEL DISPOSABLE) ×4 IMPLANT
TRAY FOLEY CATH 14FR (SET/KITS/TRAYS/PACK) ×2 IMPLANT
WATER STERILE IRR 1000ML POUR (IV SOLUTION) IMPLANT

## 2011-08-10 NOTE — Anesthesia Postprocedure Evaluation (Signed)
  Anesthesia Post-op Note  Patient: Erika Garza  Procedure(s) Performed: Procedure(s) (LRB): CESAREAN SECTION (N/A)  Patient is awake, responsive, moving her legs, and has signs of resolution of her numbness. Pain and nausea are reasonably well controlled. Vital signs are stable and clinically acceptable. Oxygen saturation is clinically acceptable. There are no apparent anesthetic complications at this time. Patient is ready for discharge.

## 2011-08-10 NOTE — Progress Notes (Signed)
K.shaw,cnm at bedside, has a difficult time performing sve on pt, pt sroms during exam with moderate amt of bloody fld, cnm unsure of presentation, dr Shawnie Pons called to come check presentation

## 2011-08-10 NOTE — Progress Notes (Signed)
Dr Shawnie Pons tells pt that she has concern about pts old c-section scar and fhr decelerations, tells pt that she believes c-section is the best option, pt and fob agree to this plan of care

## 2011-08-10 NOTE — Op Note (Addendum)
Preoperative Diagnosis:  Previous C-section, Non-reassuring FHR  Postoperative Diagnosis:  Previous C-section, Non-reassuring FHR, Uterine Rupture.  Procedure: Repeat low transverse cesarean section  Surgeon: Tinnie Gens, M.D.  Asst.: None  Findings: Uterine rupture to right side of broad with peritoneal covering uterus and blood under that with fetal parts visible.  Viable female infant, Apgars 4, 9, weight see record, cord ph 7.0, vtx presentation, LOT position  Estimated blood loss: 1200 cc  Complications: None known  Specimens: Placenta to pathology  Reason for procedure: Briefly, the patient is a 36 y.o. [id number] with prior C-section and prior VBAC arrived in labor.  After SROM, cervical exam changed, fluid was bloody and FHR began having severe repetitive variables.  Procedure: Patient is a to the OR where spinal analgesia was administered. She was then placed in a supine position with left lateral tilt. She received 1 g of Ancef and SCDs were in place. A timeout was performed. She was prepped and draped in the usual sterile fashion. A Foley catheter was placed in the bladder. Alis testing showed her not quite numb but given emergent nature of the situation, local was used prior to start of case.  A knife was then used to make a Pfannenstiel incision. This incision was carried out to underlying fascia which was divided in the midline with the knife. The incision was extended laterally, sharply.  The rectus was divided in the midline.  The peritoneal cavity was entered bluntly. The bladder was adherent to the peritoneum and the serosal layer was noted to have blood under it on the uterus.  Fetal parts were visualized and no incision was made into the uterus.  Fetus was in LOT position and was brought up out of the incision without difficulty.  Terminal meconium was noted.  Infant had decent tone. Cord was clamped x 2 and cut. Infant taken to waiting pediatrician. Cord pH was obtained. Cord  blood was obtained. Placenta was delivered from the uterus.  Uterus was cleaned with dry lap pads. Uterine incision closed with 0 Vicryl suture in a locked running fashion. A second layer of 0 Vicryl was used to over-sew the rupture.  The rupture extended into the broad ligament on the patient's right side.  Several figure of eights were used to achieve hemostasis.  Avitine was used to ensure hemostasis.  Fascia is closed with 0 Vicryl suture in a running fashion. Subcutaneous tissue infused with remaining 0.25% Marcaine.   Skin closed using 3-0 Vicryl on a Keith needle.  All instrument, needle and lap counts were correct x 2.  Patient was awake and taken to PACU stable.  Infant to Newborn Nursery, stable.

## 2011-08-10 NOTE — Progress Notes (Signed)
Dr Shawnie Pons at bedside, performs bedside US and confirms that baby is vertex, does sve and feels compound presentation, md tries to push babies hand back but unsuccessful, explains to pt that sometimes this problem may resolve on its own with time or that babies can sometimes be born vaginally with compound presentations

## 2011-08-10 NOTE — Anesthesia Postprocedure Evaluation (Signed)
  Anesthesia Post-op Note  Patient: Erika Garza  Procedure(s) Performed: Procedure(s) (LRB): CESAREAN SECTION (N/A)  Patient Location: 101  Anesthesia Type: Spinal  Level of Consciousness: awake, alert  and oriented  Airway and Oxygen Therapy: Patient Spontanous Breathing  Post-op Pain: none  Post-op Assessment: Post-op Vital signs reviewed, Patient's Cardiovascular Status Stable, No headache, No backache, No residual numbness and No residual motor weakness  Post-op Vital Signs: Reviewed and stable  Complications: No apparent anesthesia complications

## 2011-08-10 NOTE — Consult Note (Signed)
Neonatology Note:   Attendance at C-section:    I was asked to attend this repeat C/S at term due to [facility] following attempted VBAC. The mother is a G5P2A2  A pos, GBS neg with failed VBAC. ROM 9 hours prior to delivery, fluid clear. At C/S, a uterine rupture was found Infant appeared poorly perfused, had decreased tone, and had irregular respirations. Bulb suctioned for scant, light green secretions and given vigorous stimulation with increase in HR, improvement in color, and regular respirations by 2-3 minutes of life. By 4 minutes, her tone and color were normal. Ap 4/9. Lungs clear to ausc in DR. Allowed to stay with mother in OR for skin to skin time. To CN to care of Pediatrician.   Deatra James, MD

## 2011-08-10 NOTE — Progress Notes (Signed)
UR chart review completed.  

## 2011-08-10 NOTE — Progress Notes (Signed)
Pt attempting natural delivery, constantly changing positions therefore fhr tracing is intermittent, rn remains at bedside adjusting efm

## 2011-08-11 ENCOUNTER — Encounter (HOSPITAL_COMMUNITY): Payer: Self-pay | Admitting: Family Medicine

## 2011-08-11 NOTE — Progress Notes (Signed)
Agree with above.  Patient seen and examined by Dr. Candelaria Celeste.  Jaynie Collins, M.D. 08/11/2011 9:09 AM

## 2011-08-11 NOTE — Progress Notes (Signed)
Post Partum Day / POD #1 Erika Garza is a 36 yo [id number] at PPD/POD # 1 after LTCS  Subjective: no complaints, up ad lib, voiding and tolerating PO Erika Garza is a 36 yo [id number] at PPD/POD # 1 after LTCS doing well with no complaints at this time.  Her pain is well controlled with pain medication.  She has minimal lochia.  She notes no drainage with her incision. She is urinating and walking without difficulty.  She denies flatus or BM at this point. She continues to breast feed.  She plans on using the [place] and barrier method for future family planning.    Objective: Blood pressure 101/68, pulse 72, temperature 98 F (36.7 C), temperature source Oral, resp. rate 18, height 5\' 6"  (1.676 m), weight 73.993 kg (163 lb 2 oz), last menstrual period 11/13/2010, SpO2 98.00%, unknown if currently breastfeeding.  Physical Exam:  General: alert, cooperative, appears stated age and no distress Lochia: appropriate Uterine Fundus: Firm and approximately at umbilicus Incision: healing well, minimal bloody drainage on left lateral portion of incision, no dehiscence noted DVT Evaluation: No evidence of DVT seen on physical exam, minimal pedal edema noted   Basename 08/10/11 0520 08/09/11 2205  HGB 11.2* 13.1  HCT 33.6* 39.0    Assessment/Plan: 36 yo [id number] at PPD/POD # 1 after LTCS  Plan: Continue postpartum care and will discuss with OB team.   LOS: 2 days   Mardene Speak 08/11/2011, 8:05 AM

## 2011-08-12 NOTE — Progress Notes (Signed)
Subjective: Postpartum Day 2: Cesarean Delivery Patient reports incisional pain, tolerating PO and no problems voiding.    Objective: Vital signs in last 24 hours: Temp:  [97.4 F (36.3 C)-98.6 F (37 C)] 97.4 F (36.3 C) (03/15 0514) Pulse Rate:  [81-93] 81  (03/15 0514) Resp:  [18] 18  (03/15 0514) BP: (112-113)/(72-75) 113/75 mmHg (03/15 0514)  Physical Exam:  General: alert, appears stated age and no distress Lochia: appropriate Uterine Fundus: firm Incision: healing well DVT Evaluation: No evidence of DVT seen on physical exam.   Basename 08/10/11 0520 08/09/11 2205  HGB 11.2* 13.1  HCT 33.6* 39.0    Assessment/Plan: Status post Cesarean section. Doing well postoperatively.  Continue current care. Anticipate DC in am  Erika Garza E. 08/12/2011, 7:45 AM

## 2011-08-13 MED ORDER — OXYCODONE-ACETAMINOPHEN 5-325 MG PO TABS: 1.0000 | ORAL_TABLET | ORAL | Status: AC | PRN

## 2011-08-13 MED ORDER — NORETHINDRONE 0.35 MG PO TABS: 1.0000 | ORAL_TABLET | Freq: Every day | ORAL | Status: DC

## 2011-08-13 NOTE — Discharge Instructions (Signed)

## 2011-08-13 NOTE — Progress Notes (Signed)
Subjective: Postpartum Day 3: Cesarean Delivery Patient reports tolerating PO, + BM and no problems voiding.    Objective: Vital signs in last 24 hours: Temp:  [98 F (36.7 C)-98.7 F (37.1 C)] 98.5 F (36.9 C) (03/16 0500) Pulse Rate:  [74-76] 74  (03/16 0500) Resp:  [18] 18  (03/16 0500) BP: (112-128)/(75-89) 112/77 mmHg (03/16 0500)  Physical Exam:  General: alert, cooperative and no distress Lochia: appropriate Uterine Fundus: firm Incision: healing well, no significant drainage, no dehiscence, no significant erythema DVT Evaluation: No evidence of DVT seen on physical exam.  No results found for this basename: HGB:2,HCT:2 in the last 72 hours  Assessment/Plan: Status post Cesarean section. Doing well postoperatively.  Discharge home with standard precautions and return to clinic in 4-6 weeks.  Erika Garza 08/13/2011, 7:21 AM

## 2011-08-13 NOTE — Discharge Summary (Signed)
Obstetric Discharge Summary Reason for Admission: onset of labor Prenatal Procedures: ultrasound Intrapartum Procedures: cesarean: low cervical, transverse and Patient went to section for uterine rupture after TOLAC. Postpartum Procedures: none Complications-Operative and Postpartum: none Hemoglobin  Date Value Range Status  08/10/2011 11.2* 12.0-15.0 (g/dL) Final     HCT  Date Value Range Status  08/10/2011 33.6* 36.0-46.0 (%) Final    Physical Exam:  General: alert, cooperative and no distress Lochia: appropriate Uterine Fundus: firm Incision: healing well, no significant drainage, no dehiscence, no significant erythema DVT Evaluation: No evidence of DVT seen on physical exam.  Discharge Diagnoses: Term Pregnancy-delivered and C-section due to Uterine Rupture  Discharge Information: Date: 08/13/2011 Activity: pelvic rest Diet: routine Medications: PNV and Percocet, Micronor Condition: stable Instructions: refer to practice specific booklet Discharge to: home Follow-up Information    Schedule an appointment as soon as possible for a visit with [facility].   Contact information:   [address] [place] [address]          Newborn Data: Live born female  Birth Weight: 7 lb 4.2 oz (3295 g) APGAR: 4, 9  Home with mother.  Erika Garza 08/13/2011, 7:28 AM

## 2011-08-14 LAB — TYPE AND SCREEN
ABO/RH(D): A POS
Unit division: 0

## 2011-08-24 ENCOUNTER — Encounter: Payer: Self-pay | Admitting: Obstetrics & Gynecology

## 2011-08-24 ENCOUNTER — Ambulatory Visit (INDEPENDENT_AMBULATORY_CARE_PROVIDER_SITE_OTHER): Payer: PRIVATE HEALTH INSURANCE | Admitting: Obstetrics & Gynecology

## 2011-08-24 VITALS — BP 128/84 | HR 71 | Temp 96.7°F | Resp 16 | Ht 66.0 in | Wt 143.0 lb

## 2011-08-24 DIAGNOSIS — G8918 Other acute postprocedural pain: Secondary | ICD-10-CM

## 2011-08-24 NOTE — Progress Notes (Signed)
  Subjective:    Patient ID: Erika Garza, female    DOB: 28-Dec-1975, 36 y.o.   MRN: [id number]  HPI  Pt presents 2 weeks post op from a c/s for a ruptured uterus during VBAC attempt.  Pt had more pain than expected.  She has taken 20 percocet over 2 weeks, last percocet is 3 days ago.  Pt moves well.  She has more pain on the right than the left.  Pt denies problems with the incision.   Review of Systems     Objective:   Physical Exam   Abdomen-soft, NT, ND Incision-well healed Extremities--NO signs of DVT       Assessment & Plan:   36 you P3 female post op 2 weeks from c.s for uterine rupture during VBAC attempt.  Nml post operative pain. Next pregnancy will be by planned c/s

## 2011-09-13 ENCOUNTER — Other Ambulatory Visit: Payer: Self-pay | Admitting: Obstetrics & Gynecology

## 2011-09-13 ENCOUNTER — Other Ambulatory Visit (INDEPENDENT_AMBULATORY_CARE_PROVIDER_SITE_OTHER): Payer: PRIVATE HEALTH INSURANCE | Admitting: *Deleted

## 2011-09-13 VITALS — BP 119/73 | HR 71 | Temp 97.5°F | Resp 16

## 2011-09-13 DIAGNOSIS — O909 Complication of the puerperium, unspecified: Secondary | ICD-10-CM

## 2011-09-13 DIAGNOSIS — O8600 Infection of obstetric surgical wound, unspecified: Secondary | ICD-10-CM

## 2011-09-13 MED ORDER — SULFAMETHOXAZOLE-TRIMETHOPRIM 800-160 MG PO TABS: 1.0000 | ORAL_TABLET | Freq: Two times a day (BID) | ORAL | Status: DC

## 2011-09-13 NOTE — Progress Notes (Signed)
Pt here because she has a small area on lt side of c/s section that is oozing and red.  She is 5 wks post partum.  She does have a history of MRSA and is concerned about this area.  Pt is afebrile today and other than the area stinging she denies any pain.  Wound culture obtained today and RX for Bactrim DS #20 Twice daily sent to [facility] outpatient pharmacy per Elsie Lincoln, MD.  Pt instructed to call if any fevers, pain or increasing in red streaks.  She does have her original post partum appt next week.

## 2011-09-15 ENCOUNTER — Telehealth: Payer: Self-pay | Admitting: *Deleted

## 2011-09-15 NOTE — Telephone Encounter (Signed)
Pt called stating that her c/section incision is looking worse even after being on ATB for 2 days.  She is afraid there maybe more pockets of infection under the incision.  Recommended being seen in office.

## 2011-09-16 ENCOUNTER — Inpatient Hospital Stay (HOSPITAL_COMMUNITY)
Admission: AD | Admit: 2011-09-16 | Discharge: 2011-09-16 | Disposition: A | Discharge status: Home / Self Care | Payer: PRIVATE HEALTH INSURANCE | Source: Ambulatory Visit | Attending: Obstetrics & Gynecology | Admitting: Obstetrics & Gynecology

## 2011-09-16 ENCOUNTER — Encounter (HOSPITAL_COMMUNITY): Payer: Self-pay | Admitting: *Deleted

## 2011-09-16 ENCOUNTER — Ambulatory Visit (INDEPENDENT_AMBULATORY_CARE_PROVIDER_SITE_OTHER): Payer: PRIVATE HEALTH INSURANCE | Admitting: Family

## 2011-09-16 VITALS — BP 126/80 | HR 69 | Temp 97.7°F | Ht 66.0 in | Wt 141.0 lb

## 2011-09-16 DIAGNOSIS — L02219 Cutaneous abscess of trunk, unspecified: Secondary | ICD-10-CM | POA: Insufficient documentation

## 2011-09-16 DIAGNOSIS — T8149XA Infection following a procedure, other surgical site, initial encounter: Secondary | ICD-10-CM | POA: Insufficient documentation

## 2011-09-16 DIAGNOSIS — A4902 Methicillin resistant Staphylococcus aureus infection, unspecified site: Secondary | ICD-10-CM | POA: Insufficient documentation

## 2011-09-16 DIAGNOSIS — T8140XA Infection following a procedure, unspecified, initial encounter: Secondary | ICD-10-CM

## 2011-09-16 DIAGNOSIS — O909 Complication of the puerperium, unspecified: Secondary | ICD-10-CM | POA: Insufficient documentation

## 2011-09-16 DIAGNOSIS — O8600 Infection of obstetric surgical wound, unspecified: Secondary | ICD-10-CM

## 2011-09-16 DIAGNOSIS — L03319 Cellulitis of trunk, unspecified: Secondary | ICD-10-CM | POA: Insufficient documentation

## 2011-09-16 LAB — WOUND CULTURE

## 2011-09-16 MED ORDER — SULFAMETHOXAZOLE-TRIMETHOPRIM 800-160 MG PO TABS: 2.0000 | ORAL_TABLET | Freq: Two times a day (BID) | ORAL | Status: AC

## 2011-09-16 NOTE — MAU Note (Signed)
C/s on March 13, emergent - uterine rupture. On Bactrim since Tues, infection seems to be getting worse.  Hx of MRSA  In knee wound over  A year ago, incision cultured at office this week was + for MRSA. Baby is doing well, siblings adjusting to new baby.  Breast feeding is going well.

## 2011-09-16 NOTE — Progress Notes (Signed)
  Subjective:    Patient ID: Erika Garza, female    DOB: 1976/04/28, 36 y.o.   MRN: [id number]  HPI Pt is here with report of increased redness around incision site today.  Began looking infected on Monday.  Incision site was swabbed, lab report indicates MRSA.  Denies fever, body aches, or chills.  Site became increasingly red today.   Review of Systems  Gastrointestinal:       Abdomen pain around incision site; redness around site.    All other systems reviewed and are negative.       Objective:   Physical Exam  Constitutional: She is oriented to person, place, and time. She appears well-developed and well-nourished. No distress.  HENT:  Head: Normocephalic and atraumatic.  Neck: Normal range of motion. Neck supple. No thyromegaly present.  Abdominal: Bowel sounds are normal.       Incision site with area of redness; palpates hard above incision site, no opening identified.  Neurological: She is alert and oriented to person, place, and time.  Skin: Skin is warm and dry.          Assessment & Plan:  Wound Infection  Plan: MAU for further evaluation Dr. Debroah Loop and Dr. Adrian Blackwater notified of pt status and MRSA result [facility]

## 2011-09-16 NOTE — MAU Note (Signed)
Patient states she had a cesarean section on 3-13. Has had a positive MRSA culture of the wound at the [facility] office. Was seen this am and sent to MAU for drainage of the abscess.

## 2011-09-16 NOTE — MAU Note (Signed)
Dr Debroah Loop returned with Dr Adrian Blackwater to evaluate.  Unable to pack as initially thought.  Will extend antibiotic and keep a dressing for drainage.  Pt tolerated procedure.

## 2011-09-16 NOTE — Discharge Instructions (Signed)
Wound Infection  A wound infection happens when a type of germ (bacteria) starts growing in the wound. In some cases, this can cause the wound to break open. If cared for properly, the infected wound will heal from the inside to the outside. Wound infections need treatment.  CAUSES  An infection is caused by bacteria growing in the wound.   SYMPTOMS    Increase in redness, swelling, or pain at the wound site.   Increase in drainage at the wound site.   Wound or bandage (dressing) starts to smell bad.   Fever.   Feeling tired or fatigued.   Pus draining from the wound.  TREATMENT   You caregiver will prescribe antibiotic medicine. The wound infection should improve within 24 to 48 hours. Any redness around the wound should stop spreading and the wound should be less painful.   HOME CARE INSTRUCTIONS    Only take over-the-counter or prescription medicines for pain, discomfort, or fever as directed by your caregiver.   Take your antibiotics as directed. Finish them even if you start to feel better.   Gently wash the area with mild soap and water 2 times a day, or as directed. Rinse off the soap. Pat the area dry with a clean towel. Do not rub the wound. This may cause bleeding.   Follow your caregiver's instructions for how often you need to change the dressing.   Apply ointment and a dressing to the wound as directed.   If the dressing sticks, moisten it with soapy water and gently remove it.   Change the bandage right away if it becomes wet, dirty, or develops a bad smell.   Take showers. Do not take tub baths, swim, or do anything that may soak the wound until it is healed.   Avoid exercises that make you sweat heavily.   Use anti-itch medicine as directed by your caregiver. The wound may itch when it is healing. Do not pick or scratch at the wound.   Follow up with your caregiver to get your wound rechecked as directed.  SEEK MEDICAL CARE IF:   You have an increase in swelling, pain, or redness  around the wound.   You have an increase in the amount of pus coming from the wound.   There is a bad smell coming from the wound.   More of the wound breaks open.   You have a fever.  MAKE SURE YOU:    Understand these instructions.   Will watch your condition.   Will get help right away if you are not doing well or get worse.  Document Released: 02/12/2003 Document Revised: 05/05/2011 Document Reviewed: 09/19/2010  ExitCare Patient Information 2012 [employer].

## 2011-09-16 NOTE — MAU Provider Note (Signed)
  History     CSN: [id number]  Arrival date and time: 09/16/11 1233   First Provider Initiated Contact with Patient 09/16/11 1352      Chief Complaint  Patient presents with  . Wound Infection   HPI This is a 36 year old patient who is approximately 5 weeks postpartum from a cesarean section due to uterine rupture during a TOLAC.  She began to have erythema and draining from left side of incision site.   Pertinent Gynecological History: Menses: postpartum Bleeding: postpartum Contraception: abstinence DES exposure: denies Blood transfusions: none Sexually transmitted diseases: no past history Previous GYN Procedures:   Last mammogram: n/a Date:  Last pap:  Date:    Past Medical History  Diagnosis Date  . Anorexia     H/O  . Osteoporosis 2009    Took Forteo between pregnancies    Past Surgical History  Procedure Date  . Cesarean section 6/07  . Cesarean section 08/10/2011    Procedure: CESAREAN SECTION;  Surgeon: Reva Bores, MD;  Location: [facility];  Service: Gynecology;  Laterality: N/A;  Repeat Cesarean Section Delivery Baby Girl @ 0134, Apgars 4/9    Family History  Problem Relation Age of Onset  . Depression Paternal Grandmother   . Cancer Paternal Grandmother     bladder  . Hypertension Mother   . Hypothyroidism Mother   . Hypertension Brother   . Anorexia nervosa Sister   . Anesthesia problems Neg Hx     History  Substance Use Topics  . Smoking status: Never Smoker   . Smokeless tobacco: Never Used  . Alcohol Use: Yes     occ    Allergies: No Known Allergies  Prescriptions prior to admission  Medication Sig Dispense Refill  . Calcium Carbonate-Vitamin D (CALTRATE 600+D PO) Take 600 mg by mouth 1 day or 1 dose.        . norethindrone (ORTHO MICRONOR) 0.35 MG tablet Take 1 tablet (0.35 mg total) by mouth daily. Please take at same time every day.  1 Package  11  . Nutritional Supplements (JUICE PLUS FIBRE PO) Take by mouth daily.        .  Prenatal Vit-Fe Psac Cmplx-FA (PRENATAL MULTIVITAMIN) 60-1 MG tablet Take 1 tablet by mouth daily with breakfast.        . sulfamethoxazole-trimethoprim (BACTRIM DS) 800-160 MG per tablet Take 1 tablet by mouth 2 (two) times daily.  20 tablet  0    Review of Systems  Constitutional: Negative for fever and chills.  Gastrointestinal:       Drainage at wound site, redness, not much pain, Breastfeeding baby with no problem   Physical Exam   Blood pressure 126/80, pulse 86, temperature 98.1 F (36.7 C), temperature source Oral, resp. rate 18, height 5\' 6"  (1.676 m), weight 64.32 kg (141 lb 12.8 oz), SpO2 99.00%, currently breastfeeding.  Physical Exam NAD, pleasant, normal affect Abdomen: transverse low incision with approximately  10x7 cm area of redness and 5x3 cm induration, minimal tenderness, no pus, slight bleeding, no pockets found by probe with cotton swab MAU Course  Procedures  MDM moderate  Assessment and Plan  Cellulitis at abdominal wound, positive MRSA, on Bactrim, sensitive. Increase dose to 2 tabs BID for total 14 days. F/U 4/23 at [facility].  Erika Garza JEHIEL 09/16/2011, 2:06 PM   ARNOLD,JAMES 3:00 PM

## 2011-09-20 ENCOUNTER — Encounter: Payer: Self-pay | Admitting: Obstetrics & Gynecology

## 2011-09-20 ENCOUNTER — Ambulatory Visit (INDEPENDENT_AMBULATORY_CARE_PROVIDER_SITE_OTHER): Payer: PRIVATE HEALTH INSURANCE | Admitting: Obstetrics & Gynecology

## 2011-09-20 VITALS — BP 128/73 | HR 73 | Temp 96.9°F | Resp 17 | Ht 66.0 in | Wt 144.0 lb

## 2011-09-20 DIAGNOSIS — T8130XA Disruption of wound, unspecified, initial encounter: Secondary | ICD-10-CM

## 2011-09-20 DIAGNOSIS — A4902 Methicillin resistant Staphylococcus aureus infection, unspecified site: Secondary | ICD-10-CM

## 2011-09-20 NOTE — Progress Notes (Signed)
  Subjective:     Erika Garza is a 36 y.o. female who presents for a postpartum visit. She is 5 weeks postpartum following a low cervical transverse Cesarean section.  Pt had a uterine rupture during VBAC attempt.  Pt needed emergency c/s. I have fully reviewed the prenatal and intrapartum course. The delivery was at 40 gestational weeks. Outcome: VBAC attempted and ruptured uterus. Anesthesia: spinal. Postpartum course has been complicated by MRSA skin infection. Baby's course has been uncomplicated. Baby is feeding by breast. Bleeding staining only. Bowel function is normal. Bladder function is normal. Patient is not sexually active. Contraception method is progerstone only pills. Postpartum depression screening: negative.  The following portions of the patient's history were reviewed and updated as appropriate: allergies, current medications, past family history, past medical history, past social history, past surgical history and problem list.  Review of Systems A comprehensive review of systems was negative except for: skin infection   Objective:    BP 128/73  Pulse 73  Temp(Src) 96.9 F (36.1 C) (Oral)  Resp 17  Ht 5\' 6"  (1.676 m)  Wt 144 lb (65.318 kg)  BMI 23.24 kg/m2  Breastfeeding? Yes  General:  alert, cooperative and no distress   Breasts:  negative  Lungs: clear to auscultation bilaterally  Heart:  regular rate and rhythm  Abdomen: soft, non-tender; bowel sounds normal; no masses,  no organomegaly   Vulva:  not evaluated  Vagina: not evaluated  Cervix:  not evaluated  Corpus: not evaluated  Adnexa:  normal adnexa  Rectal Exam: not evaluated        Assessment:    5 week postpartum exam.   Plan:    1. Contraception: oral progesterone-only contraceptive 2. Continue Bactrim BID 3. Follow up in: 1 week or as needed.

## 2011-09-28 ENCOUNTER — Encounter: Payer: Self-pay | Admitting: Obstetrics & Gynecology

## 2011-09-28 ENCOUNTER — Ambulatory Visit (INDEPENDENT_AMBULATORY_CARE_PROVIDER_SITE_OTHER): Payer: PRIVATE HEALTH INSURANCE | Admitting: Obstetrics & Gynecology

## 2011-09-28 VITALS — BP 119/72 | HR 73 | Temp 97.4°F | Resp 16 | Ht 66.0 in | Wt 141.1 lb

## 2011-09-28 DIAGNOSIS — T8131XA Disruption of external operation (surgical) wound, not elsewhere classified, initial encounter: Secondary | ICD-10-CM

## 2011-09-28 NOTE — Progress Notes (Signed)
  Subjective:    Patient ID: Erika Garza, female    DOB: 1976-01-19, 36 y.o.   MRN: [id number]  HPI  Pt has history of MRSA wound infection after c/s.  Pt has been on 2 tablets of bactrim bid for over a week and doing much better.  No drainage  Review of Systems     Objective:   Physical Exam  Incision is intact, no drainage, no erythema, healing well Vulva-wnl Uterus, slightly enlarged, but nml for 6 weeks pp, nontentder, no ovarian masses.      Assessment & Plan:   s/p wound disruption Healing well. Continue pop for birth control Follow up prn

## 2011-10-14 ENCOUNTER — Other Ambulatory Visit: Payer: Self-pay | Admitting: *Deleted

## 2011-10-14 MED ORDER — PRENATAL MULTIVITAMIN CH: 1.0000 | ORAL_TABLET | Freq: Every morning | ORAL | Status: DC

## 2012-03-01 IMAGING — US US OB NUCHAL TRANSLUCENCY 1ST GEST
1 series · 14 of 28 positions shown · non-contrast
Comparison: none

[Series 1: us ob nuchal translucency 1st gest · 0.08mm/px · 14 of 28 slices shown]
[im 2/28]
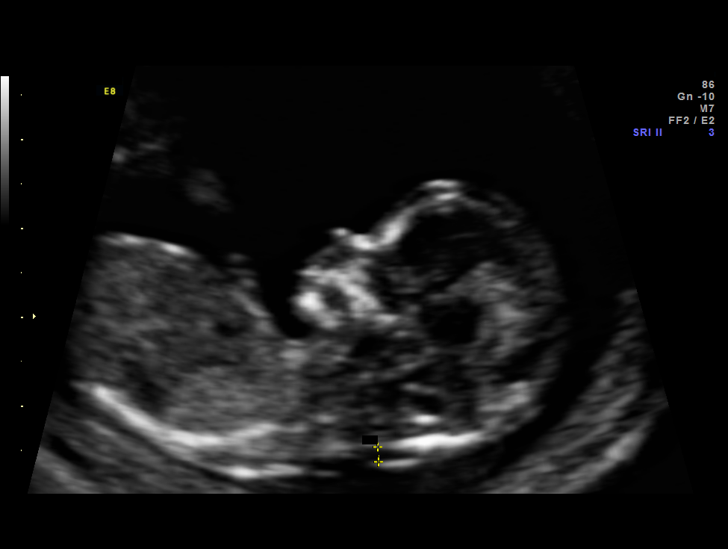
[im 4/28]
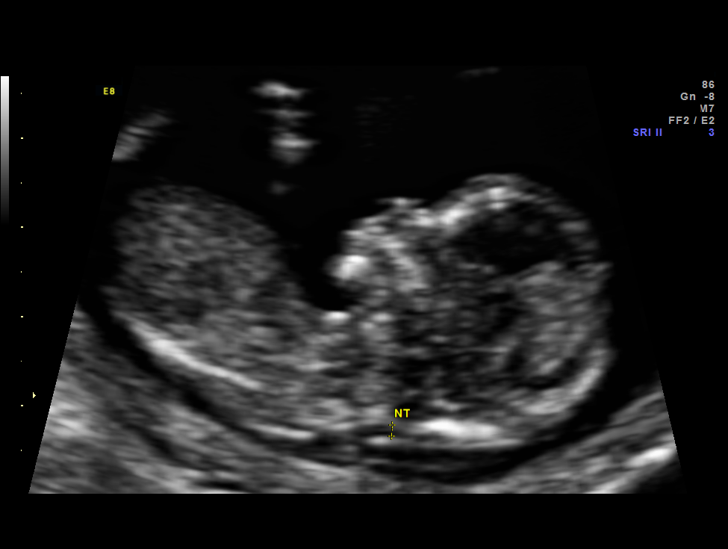
[im 6/28]
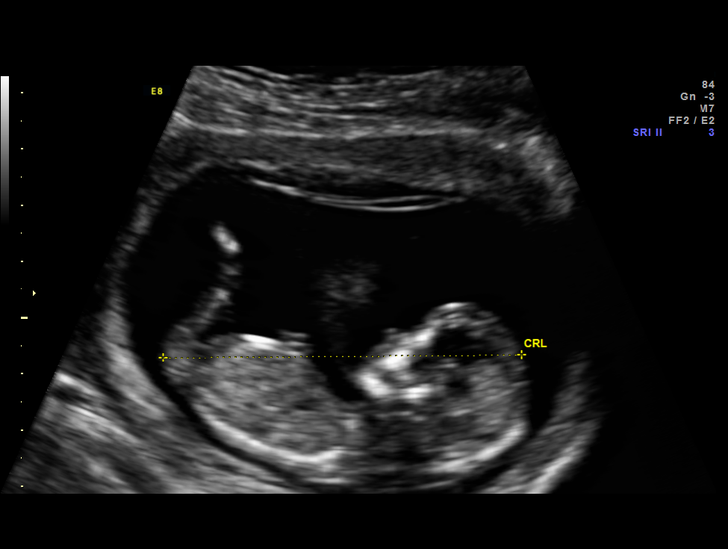
[im 8/28]
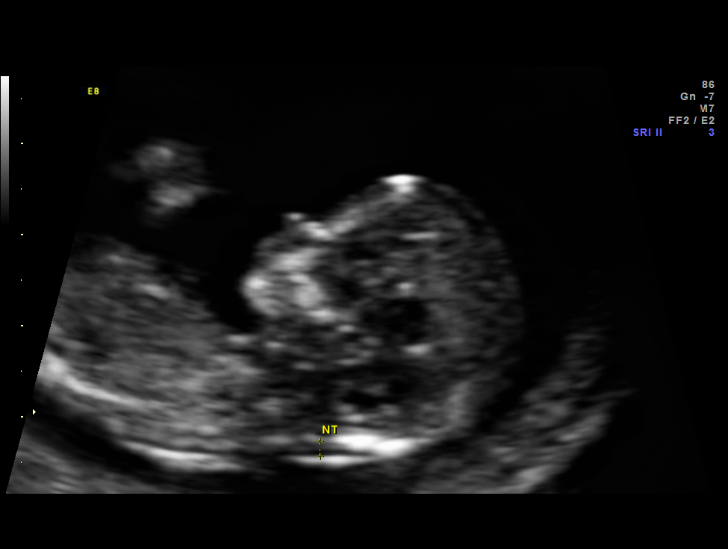
[im 10/28]
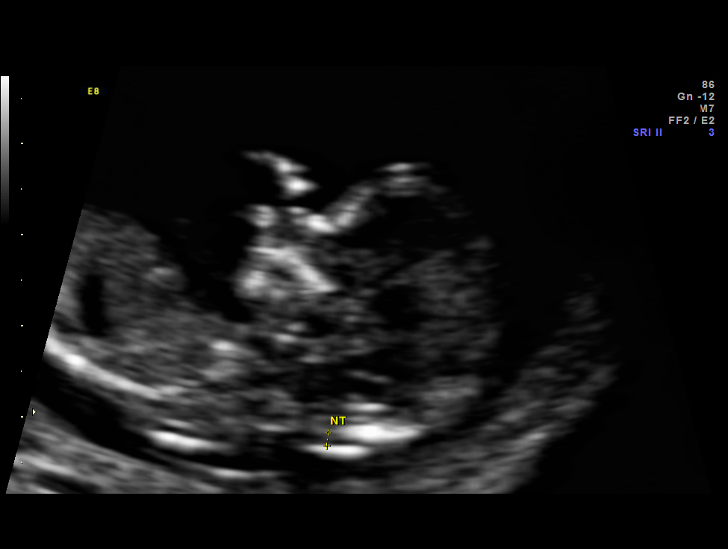
[im 12/28]
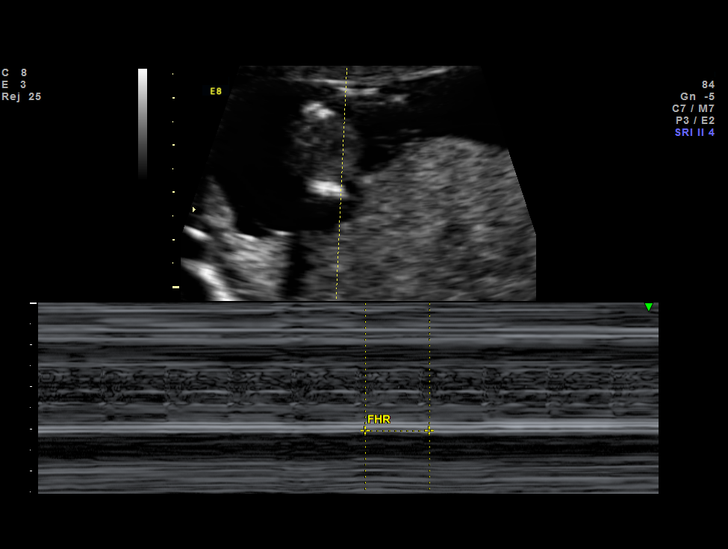
[im 14/28]
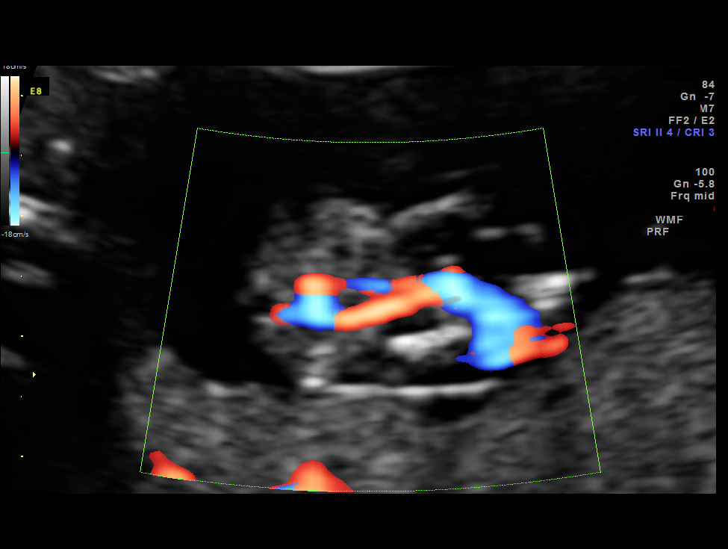
[im 16/28]
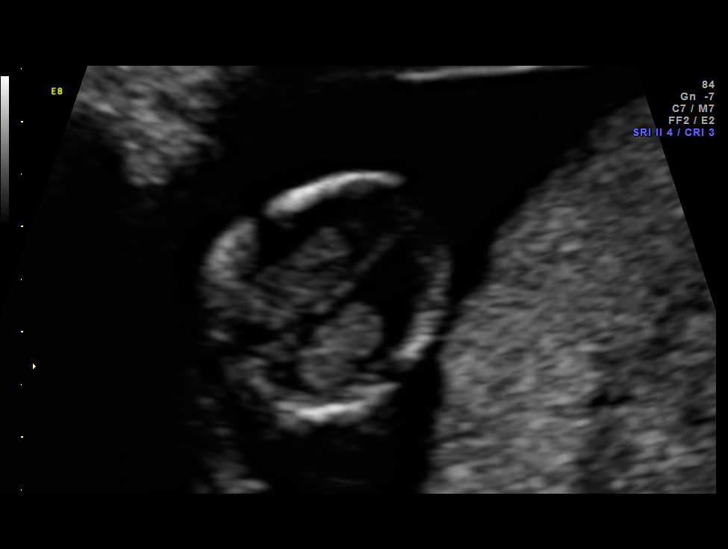
[im 18/28]
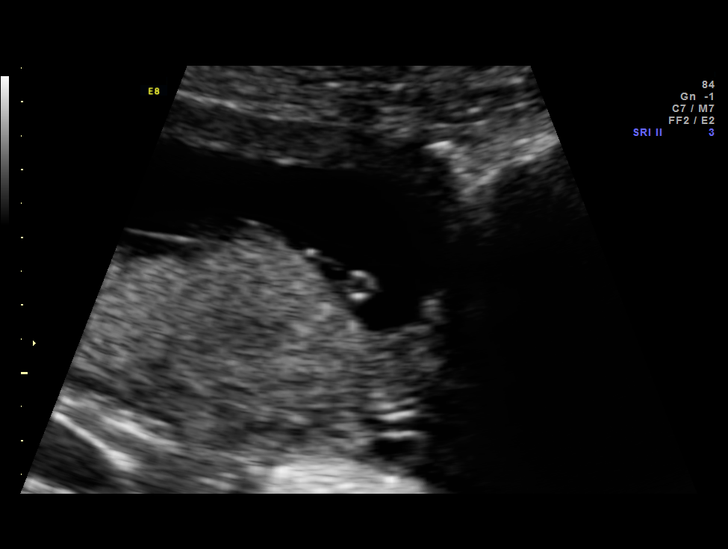
[im 20/28]
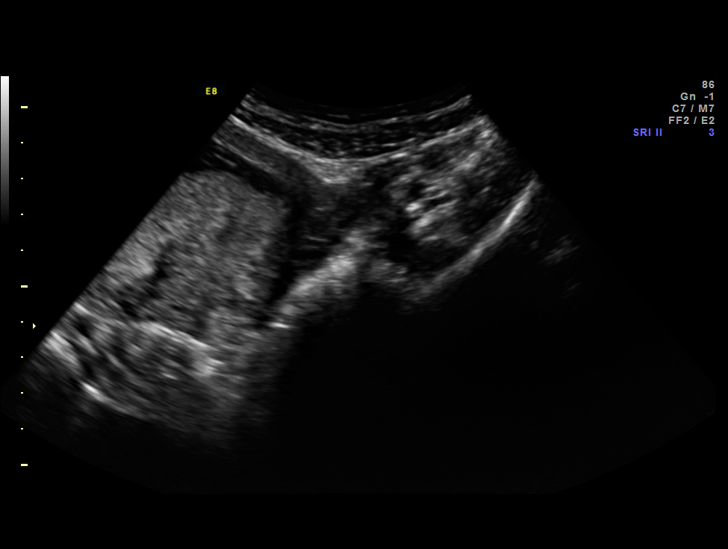
[im 22/28]
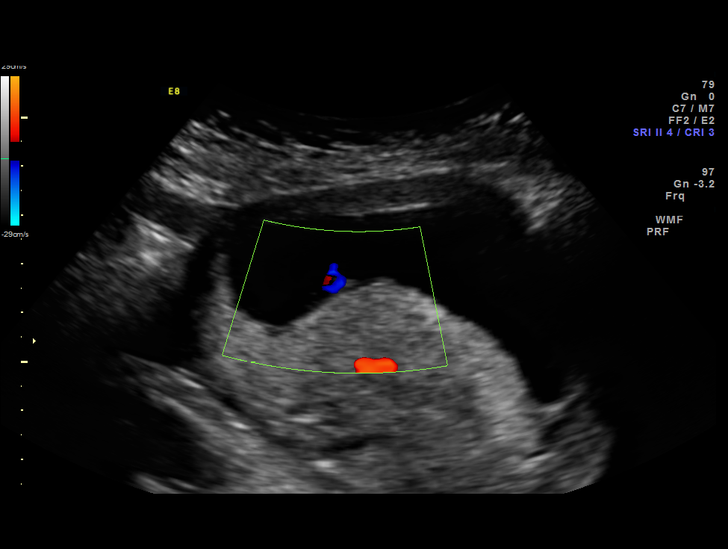
[im 24/28]
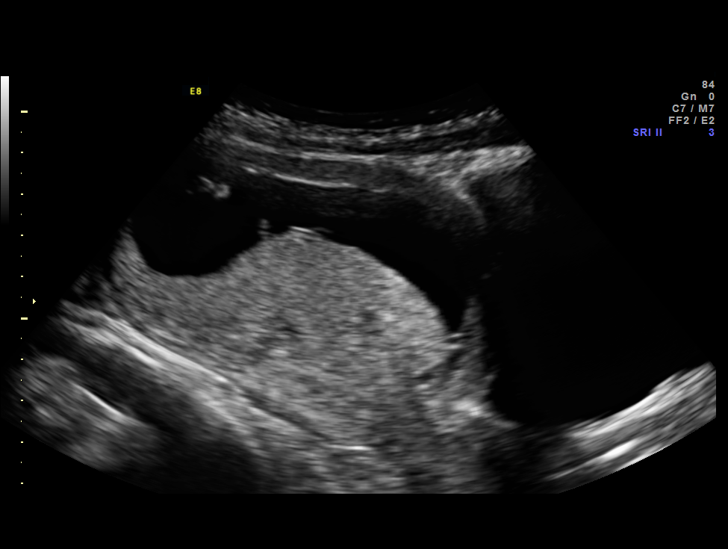
[im 26/28]
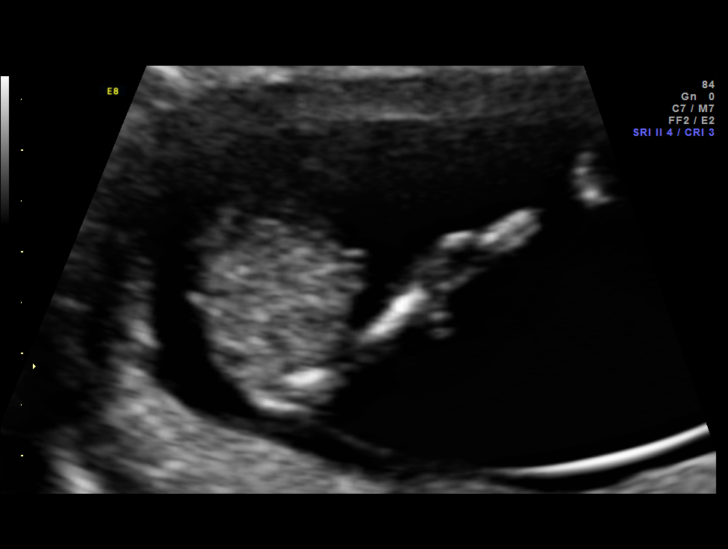
[im 28/28]
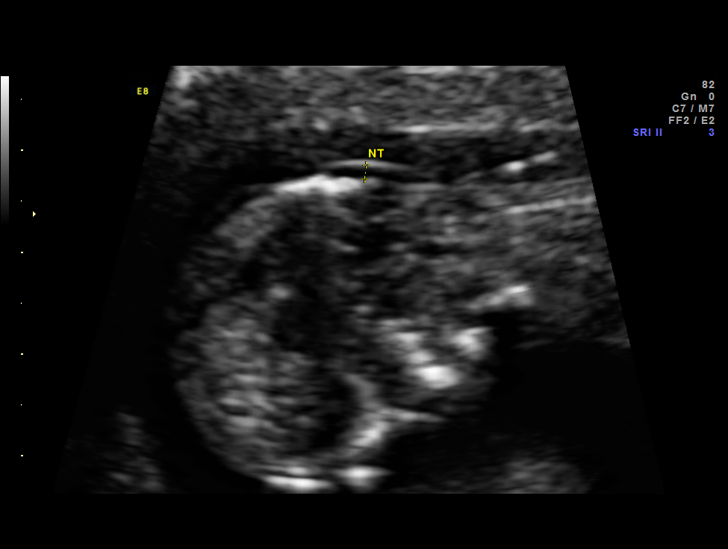

[14 of 28 positions shown; findings below may reference images not displayed]

Canned report from images found in remote index.

Refer to host system for actual result text.

## 2012-03-20 ENCOUNTER — Encounter: Payer: Self-pay | Admitting: Obstetrics & Gynecology

## 2012-03-20 ENCOUNTER — Ambulatory Visit (INDEPENDENT_AMBULATORY_CARE_PROVIDER_SITE_OTHER): Payer: PRIVATE HEALTH INSURANCE | Admitting: Obstetrics & Gynecology

## 2012-03-20 VITALS — BP 135/82 | HR 101 | Temp 97.2°F | Resp 16 | Ht 65.0 in | Wt 138.0 lb

## 2012-03-20 DIAGNOSIS — B373 Candidiasis of vulva and vagina: Secondary | ICD-10-CM

## 2012-03-20 DIAGNOSIS — Z23 Encounter for immunization: Secondary | ICD-10-CM

## 2012-03-20 MED ORDER — INFLUENZA VIRUS VACC SPLIT PF IM SUSP: 0.5000 mL | Freq: Once | INTRAMUSCULAR | Status: DC

## 2012-03-20 MED ORDER — FLUCONAZOLE 150 MG PO TABS: 150.0000 mg | ORAL_TABLET | Freq: Once | ORAL | Status: DC

## 2012-03-20 NOTE — Progress Notes (Signed)
  Subjective:    Patient ID: Erika Garza, female    DOB: March 14, 1976, 36 y.o.   MRN: [id number]  HPI  Pt complaining of vaginal discharge and burning since Saturday.  Recent hot tub use on girls weekend.  No other risk factors.  Discharge is white and causes itching  Review of Systems  Constitutional: Negative.   Genitourinary: Positive for vaginal discharge and vaginal pain. Negative for dysuria and urgency.       Objective:   Physical Exam  Vitals reviewed. Constitutional: She appears well-developed and well-nourished. No distress.  Genitourinary:       White curd like discharge in vagina.  Vulva erythematous.  No odor          Assessment & Plan:  36 year old female with presumptive yeast vaginitis.  1-BD Affirm 2-Diflucan

## 2012-03-20 NOTE — Addendum Note (Signed)
Addended by: Granville Lewis on: 03/20/2012 04:26 PM   Modules accepted: Orders

## 2012-03-21 ENCOUNTER — Telehealth: Payer: Self-pay | Admitting: *Deleted

## 2012-03-21 NOTE — Telephone Encounter (Signed)
Pt notified that her Affirm was positive for yeast and the RX that Dr Penne Lash gave her should take care of the itching.

## 2012-04-11 ENCOUNTER — Encounter: Payer: Self-pay | Admitting: Obstetrics & Gynecology

## 2012-04-11 ENCOUNTER — Ambulatory Visit (INDEPENDENT_AMBULATORY_CARE_PROVIDER_SITE_OTHER): Payer: PRIVATE HEALTH INSURANCE | Admitting: Obstetrics & Gynecology

## 2012-04-11 DIAGNOSIS — B373 Candidiasis of vulva and vagina: Secondary | ICD-10-CM

## 2012-04-11 MED ORDER — FLUCONAZOLE 150 MG PO TABS: ORAL_TABLET | ORAL | Status: DC

## 2012-04-11 NOTE — Progress Notes (Signed)
Patient ID: Erika Garza, female   DOB: May 01, 1976, 36 y.o.   MRN: [id number] Pt still having symptoms of yeast.  BD Affirm was positive for yeast last week.  Visually there was a heavy load of yeast in vaginal vault.  Will retreat with diflucan 150 mg every 3 days for 3 doses.  If still symptomatic will bring back in for yeast culture.

## 2012-06-12 ENCOUNTER — Other Ambulatory Visit: Payer: Self-pay | Admitting: Obstetrics & Gynecology

## 2012-07-15 ENCOUNTER — Other Ambulatory Visit: Payer: Self-pay | Admitting: Family Medicine

## 2012-07-24 ENCOUNTER — Encounter: Payer: Self-pay | Admitting: Obstetrics & Gynecology

## 2012-07-24 ENCOUNTER — Ambulatory Visit (INDEPENDENT_AMBULATORY_CARE_PROVIDER_SITE_OTHER): Payer: PRIVATE HEALTH INSURANCE | Admitting: Obstetrics & Gynecology

## 2012-07-24 VITALS — BP 130/85 | HR 84 | Temp 97.2°F | Resp 16 | Ht 66.0 in | Wt 125.0 lb

## 2012-07-24 DIAGNOSIS — N939 Abnormal uterine and vaginal bleeding, unspecified: Secondary | ICD-10-CM

## 2012-07-24 LAB — TSH: TSH: 1.524 u[IU]/mL (ref 0.350–4.500)

## 2012-07-24 LAB — POCT URINE PREGNANCY: Preg Test, Ur: NEGATIVE

## 2012-07-24 MED ORDER — NORETHIN ACE-ETH ESTRAD-FE 1-20 MG-MCG(24) PO TABS: 1.0000 | ORAL_TABLET | Freq: Every day | ORAL | Status: DC

## 2012-07-24 NOTE — Progress Notes (Signed)
  Subjective:    Patient ID: Marthe Patch, female    DOB: 27-Jan-1976, 37 y.o.   MRN: [id number]  HPI  Pt has been having irregular bleeding on the micronor.  She has not been breast feeding for approx 2 months.  UPT is negative.  Husband has also been using condoms as back up method.  Pt has been on combination OCPs in the past and would like to restart.  Pt has been having hair loss.    Review of Systems As per HPI    Objective:   Physical Exam  Filed Vitals:   07/24/12 1506  BP: 130/85  Pulse: 84  Temp: 97.2 F (36.2 C)  TempSrc: Oral  Resp: 16  Height: 5\' 6"  (1.676 m)  Weight: 125 lb (56.7 kg)   BMI 20.2  Well nourished, no distress NO exam needed at this time      Assessment & Plan:  37 yo P3 female with irergular bleeding on micronor s/p breast feeding.  Check TSH Change pills to loestrin fe 24 Condoms x2 weeks RTC prn

## 2012-07-25 ENCOUNTER — Telehealth: Payer: Self-pay | Admitting: *Deleted

## 2012-07-25 NOTE — Telephone Encounter (Signed)
LM on pt's voicemail that her TSH was normal

## 2012-07-25 NOTE — Telephone Encounter (Signed)
Pt called stating that her new RX for Lo Estrin FE was not received at the [facility] pharmacy.  I called the RX into the pharmacy and left on voicemail.  The pharmacy had originally given her Micronor instead of the new RX that Dr Penne Lash prescribed yesterday

## 2012-12-31 ENCOUNTER — Other Ambulatory Visit: Payer: Self-pay | Admitting: Obstetrics & Gynecology

## 2013-06-28 ENCOUNTER — Other Ambulatory Visit: Payer: Self-pay | Admitting: Obstetrics & Gynecology

## 2013-07-01 ENCOUNTER — Other Ambulatory Visit: Payer: Self-pay | Admitting: Obstetrics & Gynecology

## 2013-07-01 ENCOUNTER — Telehealth: Payer: Self-pay | Admitting: *Deleted

## 2013-07-01 ENCOUNTER — Encounter: Payer: Self-pay | Admitting: *Deleted

## 2013-07-01 NOTE — Telephone Encounter (Signed)
Letter sent to patient to remind her of the need for an appt with Dr Penne LashLeggett.

## 2013-07-01 NOTE — Telephone Encounter (Signed)
Message copied by Granville LewisLARK, LORA L on Mon Jul 01, 2013 12:25 PM ------      Message from: Lesly DukesLEGGETT, KELLY H      Created: Fri Jun 28, 2013  1:18 PM       Just refilled OCPs.  Pt needs exam / BP check.  Can you call her. ------

## 2013-09-10 ENCOUNTER — Encounter: Payer: Self-pay | Admitting: Obstetrics & Gynecology

## 2013-09-10 ENCOUNTER — Ambulatory Visit (INDEPENDENT_AMBULATORY_CARE_PROVIDER_SITE_OTHER): Payer: PRIVATE HEALTH INSURANCE | Admitting: Obstetrics & Gynecology

## 2013-09-10 VITALS — BP 136/95 | HR 85 | Temp 97.9°F | Resp 18 | Ht 66.0 in | Wt 114.0 lb

## 2013-09-10 DIAGNOSIS — Z1151 Encounter for screening for human papillomavirus (HPV): Secondary | ICD-10-CM

## 2013-09-10 DIAGNOSIS — Z01419 Encounter for gynecological examination (general) (routine) without abnormal findings: Secondary | ICD-10-CM

## 2013-09-10 DIAGNOSIS — Z124 Encounter for screening for malignant neoplasm of cervix: Secondary | ICD-10-CM

## 2013-09-10 NOTE — Progress Notes (Signed)
  Subjective:     Erika Garza is a 38 y.o. female here for a routine exam.  Current complaints: none.  Doing well on birth control.  Probably done with child bearing but not ready for sterilization.  Personal health questionnaire reviewed: yes.  Aunt diagnosed with post menopausal breast cancer.  Gynecologic History Patient's last menstrual period was 09/02/2013. Contraception: OCP (estrogen/progesterone) Last Pap: . Results were: normal   Obstetric History OB History  Gravida Para Term Preterm AB SAB TAB Ectopic Multiple Living  5 3 3  0 2 2 0 0 0 3    # Outcome Date GA Lbr Len/2nd Weight Sex Delivery Anes PTL Lv  5 TRM 08/10/11 5718w4d  7 lb 4.2 oz (3.295 kg) F LTCS Spinal  Y  4 SAB 09/13/10          3 SAB 03/14/10          2 TRM 03/14/08 3971w0d   M VBAC EPI  Y  1 TRM 11/12/05 3971w0d   M CS EPI N Y       The following portions of the patient's history were reviewed and updated as appropriate: allergies, current medications, past family history, past medical history, past social history, past surgical history and problem list.  Review of Systems Pertinent items are noted in HPI.    Objective:      Filed Vitals:   09/10/13 1553  BP: 136/95  Pulse: 85  Temp: 97.9 F (36.6 C)  Resp: 18  Height: 5\' 6"  (1.676 m)  Weight: 114 lb (51.71 kg)  SpO2: 96%   Vitals:  WNL General appearance: alert, cooperative and no distress Head: Normocephalic, without obvious abnormality, atraumatic Eyes: negative Throat: lips, mucosa, and tongue normal; teeth and gums normal Lungs: clear to auscultation bilaterally Breasts: normal appearance, no masses or tenderness, No nipple retraction or dimpling, No nipple discharge or bleeding Heart: regular rate and rhythm Abdomen: soft, non-tender; bowel sounds normal; no masses,  no organomegaly  Pelvic:  External Genitalia:  Tanner V, no lesion Urethra; nml Vagina:  Pink, normal rugae, no blood or discharge Cervix:  No CMT, no lesion Uterus:   Normal size and contour, non tender Adnexa:  Normal, no masses, non tender  Extremities: no edema, redness or tenderness in the calves or thighs Skin: no lesions or rash Lymph nodes: Axillary adenopathy: none        Assessment:    Healthy female exam.    Plan:    Education reviewed: self breast exams. Contraception: OCP (estrogen/progesterone). Follow up in: 1 year. tsh, chol, cbc, cmp

## 2013-09-11 ENCOUNTER — Telehealth: Payer: Self-pay | Admitting: *Deleted

## 2013-09-11 LAB — COMPREHENSIVE METABOLIC PANEL
ALBUMIN: 4.6 g/dL (ref 3.5–5.2)
ALK PHOS: 48 U/L (ref 39–117)
ALT: 23 U/L (ref 0–35)
AST: 22 U/L (ref 0–37)
BILIRUBIN TOTAL: 0.6 mg/dL (ref 0.2–1.2)
BUN: 12 mg/dL (ref 6–23)
CO2: 21 mEq/L (ref 19–32)
CREATININE: 0.74 mg/dL (ref 0.50–1.10)
Calcium: 9.5 mg/dL (ref 8.4–10.5)
Chloride: 103 mEq/L (ref 96–112)
GLUCOSE: 76 mg/dL (ref 70–99)
POTASSIUM: 3.6 meq/L (ref 3.5–5.3)
Sodium: 138 mEq/L (ref 135–145)
Total Protein: 7.5 g/dL (ref 6.0–8.3)

## 2013-09-11 LAB — TSH: TSH: 2.077 u[IU]/mL (ref 0.350–4.500)

## 2013-09-11 LAB — CBC
HEMATOCRIT: 46.3 % — AB (ref 36.0–46.0)
Hemoglobin: 15.9 g/dL — ABNORMAL HIGH (ref 12.0–15.0)
MCH: 30.1 pg (ref 26.0–34.0)
MCHC: 34.3 g/dL (ref 30.0–36.0)
MCV: 87.5 fL (ref 78.0–100.0)
PLATELETS: 239 10*3/uL (ref 150–400)
RBC: 5.29 MIL/uL — ABNORMAL HIGH (ref 3.87–5.11)
RDW: 13.6 % (ref 11.5–15.5)
WBC: 10 10*3/uL (ref 4.0–10.5)

## 2013-09-11 LAB — LIPID PANEL
CHOL/HDL RATIO: 1.9 ratio
Cholesterol: 174 mg/dL (ref 0–200)
HDL: 92 mg/dL (ref >39)
LDL CALC: 68 mg/dL (ref 0–99)
TRIGLYCERIDES: 68 mg/dL (ref <150)
VLDL: 14 mg/dL (ref 0–40)

## 2013-09-11 NOTE — Telephone Encounter (Signed)
Copy of labs mailed to pt's home address. 

## 2013-10-14 ENCOUNTER — Other Ambulatory Visit: Payer: Self-pay | Admitting: Obstetrics & Gynecology

## 2014-02-05 ENCOUNTER — Other Ambulatory Visit: Payer: Self-pay | Admitting: Obstetrics & Gynecology

## 2014-02-05 DIAGNOSIS — Z760 Encounter for issue of repeat prescription: Secondary | ICD-10-CM

## 2014-03-31 ENCOUNTER — Encounter: Payer: Self-pay | Admitting: Obstetrics & Gynecology

## 2014-06-02 ENCOUNTER — Telehealth: Payer: Self-pay

## 2014-06-02 NOTE — Telephone Encounter (Signed)
Per pharmacy-Pt request refill on birth control and prenatal vitamin. Ok to fill with 3 refills. Patient due in April for appointment.

## 2014-09-04 ENCOUNTER — Other Ambulatory Visit: Payer: Self-pay | Admitting: Obstetrics & Gynecology

## 2014-09-16 ENCOUNTER — Other Ambulatory Visit: Payer: Self-pay | Admitting: Obstetrics & Gynecology

## 2014-10-15 ENCOUNTER — Other Ambulatory Visit: Payer: Self-pay | Admitting: Obstetrics & Gynecology

## 2014-11-13 ENCOUNTER — Other Ambulatory Visit: Payer: Self-pay | Admitting: Obstetrics & Gynecology

## 2014-11-25 ENCOUNTER — Other Ambulatory Visit: Payer: Self-pay | Admitting: Obstetrics & Gynecology

## 2015-02-19 ENCOUNTER — Telehealth: Payer: Self-pay | Admitting: *Deleted

## 2015-02-19 DIAGNOSIS — Z139 Encounter for screening, unspecified: Secondary | ICD-10-CM

## 2015-02-19 NOTE — Telephone Encounter (Signed)
Ordered MM

## 2015-02-27 ENCOUNTER — Other Ambulatory Visit: Payer: Self-pay | Admitting: Obstetrics & Gynecology

## 2015-02-27 ENCOUNTER — Ambulatory Visit (HOSPITAL_BASED_OUTPATIENT_CLINIC_OR_DEPARTMENT_OTHER)
Admission: RE | Admit: 2015-02-27 | Discharge: 2015-02-27 | Disposition: A | Discharge status: Home / Self Care | Payer: Managed Care, Other (non HMO) | Source: Ambulatory Visit | Attending: Obstetrics & Gynecology | Admitting: Obstetrics & Gynecology

## 2015-02-27 DIAGNOSIS — Z1231 Encounter for screening mammogram for malignant neoplasm of breast: Secondary | ICD-10-CM | POA: Insufficient documentation

## 2015-02-27 DIAGNOSIS — Z139 Encounter for screening, unspecified: Secondary | ICD-10-CM

## 2015-04-22 ENCOUNTER — Other Ambulatory Visit: Payer: Self-pay | Admitting: Obstetrics & Gynecology

## 2015-10-22 ENCOUNTER — Other Ambulatory Visit: Payer: Self-pay | Admitting: Obstetrics & Gynecology

## 2015-10-23 NOTE — Telephone Encounter (Signed)
Pt needs an appt.  It has been 2 years.

## 2015-12-16 ENCOUNTER — Other Ambulatory Visit: Payer: Self-pay | Admitting: Obstetrics & Gynecology

## 2015-12-17 ENCOUNTER — Telehealth: Payer: Self-pay | Admitting: *Deleted

## 2015-12-17 NOTE — Telephone Encounter (Signed)
Spoke with pt and appt made for annual with Dr Penne LashLeggett.

## 2016-01-13 ENCOUNTER — Other Ambulatory Visit: Payer: Self-pay | Admitting: Obstetrics & Gynecology

## 2016-01-21 ENCOUNTER — Ambulatory Visit (INDEPENDENT_AMBULATORY_CARE_PROVIDER_SITE_OTHER): Payer: Managed Care, Other (non HMO) | Admitting: Obstetrics & Gynecology

## 2016-01-21 ENCOUNTER — Encounter: Payer: Self-pay | Admitting: Obstetrics & Gynecology

## 2016-01-21 ENCOUNTER — Ambulatory Visit: Payer: Managed Care, Other (non HMO) | Admitting: Obstetrics & Gynecology

## 2016-01-21 VITALS — BP 144/92 | HR 88 | Ht 67.0 in | Wt 114.0 lb

## 2016-01-21 DIAGNOSIS — Z1151 Encounter for screening for human papillomavirus (HPV): Secondary | ICD-10-CM | POA: Diagnosis not present

## 2016-01-21 DIAGNOSIS — Z124 Encounter for screening for malignant neoplasm of cervix: Secondary | ICD-10-CM | POA: Diagnosis not present

## 2016-01-21 DIAGNOSIS — Z01419 Encounter for gynecological examination (general) (routine) without abnormal findings: Secondary | ICD-10-CM

## 2016-01-21 MED ORDER — NORETHIN ACE-ETH ESTRAD-FE 1-20 MG-MCG(24) PO TABS: 1.0000 | ORAL_TABLET | Freq: Every day | ORAL | 12 refills | Status: DC

## 2016-01-21 NOTE — Progress Notes (Signed)
Subjective:     Erika Garza is a 40 y.o. female here for a routine exam.  Current complaints: none.  Happy with OCPs.  No risk factors present to indicate need for stopping oral OCPs.     Gynecologic History Patient's last menstrual period was 01/08/2016 (exact date). Contraception: OCP (estrogen/progesterone) Last Pap: 2015. Results were: normal Last mammogram: age 40. Results were: normal  Obstetric History OB History  Gravida Para Term Preterm AB Living  5 3 3  0 2 3  SAB TAB Ectopic Multiple Live Births  2 0 0 0 3    # Outcome Date GA Lbr Len/2nd Weight Sex Delivery Anes PTL Lv  5 Term 08/10/11 3162w4d  7 lb 4.2 oz (3.295 kg) F CS-LTranv Spinal  LIV  4 SAB 09/13/10          3 SAB 03/14/10          2 Term 03/14/08 3883w0d   M VBAC EPI  LIV  1 Term 11/12/05 8183w0d   M CS-Unspec EPI N LIV       The following portions of the patient's history were reviewed and updated as appropriate: allergies, current medications, past family history, past medical history, past social history, past surgical history and problem list.  Review of Systems Pertinent items noted in HPI and remainder of comprehensive ROS otherwise negative.    Objective:      Vitals:   01/21/16 1343  BP: (!) 144/92  Pulse: 88  Weight: 114 lb (51.7 kg)  Height: 5\' 7"  (1.702 m)   Vitals:  WNL General appearance: alert, cooperative and no distress  HEENT: Normocephalic, without obvious abnormality, atraumatic Eyes: negative Throat: lips, mucosa, and tongue normal; teeth and gums normal  Respiratory: Clear to auscultation bilaterally  CV: Regular rate and rhythm  Breasts:  Normal appearance, no masses or tenderness, no nipple retraction or dimpling  GI: Soft, non-tender; bowel sounds normal; no masses,  no organomegaly  GU: External Genitalia:  Tanner V, no lesion Urethra:  No prolapse   Vagina: Pink, normal rugae, no blood or discharge  Cervix: No CMT, no lesion  Uterus:  Normal size and contour, non  tender  Adnexa: Normal, no masses, non tender  Musculoskeletal: No edema, redness or tenderness in the calves or thighs  Skin: No lesions or rash  Lymphatic: Axillary adenopathy: none    Psychiatric: Normal mood and behavior        Assessment:    Healthy female exam.    Plan:    Education reviewed: skin cancer screening. Contraception: OCP (estrogen/progesterone). Follow up in: 1 year. Pt elects for mammogram every other year.

## 2016-01-25 LAB — CYTOLOGY - PAP

## 2016-04-07 ENCOUNTER — Other Ambulatory Visit: Payer: Self-pay | Admitting: Obstetrics & Gynecology

## 2017-02-03 ENCOUNTER — Other Ambulatory Visit: Payer: Self-pay | Admitting: *Deleted

## 2017-02-03 ENCOUNTER — Telehealth: Payer: Self-pay | Admitting: *Deleted

## 2017-02-03 DIAGNOSIS — Z3041 Encounter for surveillance of contraceptive pills: Secondary | ICD-10-CM

## 2017-02-03 MED ORDER — NORETHIN ACE-ETH ESTRAD-FE 1-20 MG-MCG(24) PO TABS: 1.0000 | ORAL_TABLET | Freq: Every day | ORAL | 0 refills | Status: DC

## 2017-02-03 NOTE — Telephone Encounter (Signed)
RF request received from [facility] pharmacy for OCP RF.  1 RF given but pt is due for her annual with Dr Penne LashLeggett.

## 2017-02-03 NOTE — Telephone Encounter (Signed)
[facility] [place] stating they sent a refill request and were following up on it. [facility] - will forward.

## 2017-02-28 ENCOUNTER — Other Ambulatory Visit: Payer: Self-pay | Admitting: Obstetrics & Gynecology

## 2017-02-28 DIAGNOSIS — Z3041 Encounter for surveillance of contraceptive pills: Secondary | ICD-10-CM

## 2017-03-01 ENCOUNTER — Telehealth: Payer: Self-pay | Admitting: *Deleted

## 2017-03-01 ENCOUNTER — Encounter: Payer: Self-pay | Admitting: *Deleted

## 2017-03-01 NOTE — Telephone Encounter (Signed)
Letter mailed to pt's home address to inform her that she is overdue for annual and will only get a 60 day RF on OCP's per Dr Penne Lash.

## 2017-03-01 NOTE — Telephone Encounter (Signed)
-----   Message from Lesly Dukes, MD sent at 02/28/2017  8:53 PM EDT ----- Pt overdue for yearly appt.  Refill for 60 days given on OCPS.  Please call patient nad schedule.

## 2017-03-13 ENCOUNTER — Ambulatory Visit (INDEPENDENT_AMBULATORY_CARE_PROVIDER_SITE_OTHER): Payer: Managed Care, Other (non HMO) | Admitting: Obstetrics & Gynecology

## 2017-03-13 ENCOUNTER — Other Ambulatory Visit: Payer: Self-pay | Admitting: Obstetrics & Gynecology

## 2017-03-13 ENCOUNTER — Encounter: Payer: Self-pay | Admitting: Obstetrics & Gynecology

## 2017-03-13 DIAGNOSIS — Z3041 Encounter for surveillance of contraceptive pills: Secondary | ICD-10-CM

## 2017-03-13 DIAGNOSIS — Z1231 Encounter for screening mammogram for malignant neoplasm of breast: Secondary | ICD-10-CM

## 2017-03-13 DIAGNOSIS — Z01419 Encounter for gynecological examination (general) (routine) without abnormal findings: Secondary | ICD-10-CM | POA: Diagnosis not present

## 2017-03-13 MED ORDER — NORETHIN ACE-ETH ESTRAD-FE 1-20 MG-MCG(24) PO TABS: 1.0000 | ORAL_TABLET | Freq: Every day | ORAL | 12 refills | Status: DC

## 2017-03-13 NOTE — Addendum Note (Signed)
Addended by: Granville Lewis on: 03/13/2017 02:41 PM   Modules accepted: Orders

## 2017-03-13 NOTE — Progress Notes (Signed)
Subjective:     Erika Garza is a 41 y.o. female here for a routine exam.  Current complaints: none.  Doing well on OCPS.  Has PCP that follows other health maintenance..   Gynecologic History Patient's last menstrual period was 03/02/2017. Contraception: OCP (estrogen/progesterone) Last Pap: 2107. Results were: normal Last mammogram: 2016. Results were: normal  Obstetric History OB History  Gravida Para Term Preterm AB Living  0 2 3  SAB TAB Ectopic Multiple Live Births  2 0 0 0 3    # Outcome Date GA Lbr Len/2nd Weight Sex Delivery Anes PTL Lv  5 Term 08/10/11 [redacted]w[redacted]d  7 lb 4.2 oz (3.295 kg) F CS-LTranv Spinal  LIV  4 SAB 09/13/10          3 SAB 03/14/10          2 Term 03/14/08 [redacted]w[redacted]d   M VBAC EPI  LIV  1 Term 11/12/05 [redacted]w[redacted]d   M CS-Unspec EPI N LIV       The following portions of the patient's history were reviewed and updated as appropriate: allergies, current medications, past family history, past medical history, past social history, past surgical history and problem list.  Review of Systems Pertinent items noted in HPI and remainder of comprehensive ROS otherwise negative.    Objective:      Vitals:   03/13/17 1246  BP: (!) 132/98  Pulse: 95  Resp: 16  Weight: 112 lb (50.8 kg)  Height:  (1.702 m)   Vitals:  WNL General appearance: alert, cooperative and no distress  HEENT: Normocephalic, without obvious abnormality, atraumatic Eyes: negative Throat: lips, mucosa, and tongue normal; teeth and gums normal  Respiratory: Clear to auscultation bilaterally  CV: Regular rate and rhythm  Breasts:  Normal appearance, no masses or tenderness, no nipple retraction or dimpling  GI: Soft, non-tender; bowel sounds normal; no masses,  no organomegaly  GU: External Genitalia:  Tanner V, no lesion Urethra:  No prolapse   Vagina: Pink, normal rugae, no blood or discharge  Cervix: No CMT, no lesion  Uterus:  Normal size and contour, non tender  Adnexa: Normal,  no masses, non tender  Musculoskeletal: No edema, redness or tenderness in the calves or thighs  Skin: No lesions or rash  Lymphatic: Axillary adenopathy: none     Psychiatric: Normal mood and behavior    Assessment:    Healthy female exam.    Plan:   Pt opts for Mammogram q2 years.  Is due for mammogram this year. Pap in 2-4 years OCP refill Can stop prenatal vitamins. F/U with PCP for other health maintenance.

## 2017-03-22 ENCOUNTER — Ambulatory Visit (INDEPENDENT_AMBULATORY_CARE_PROVIDER_SITE_OTHER): Payer: Managed Care, Other (non HMO)

## 2017-03-22 DIAGNOSIS — Z1231 Encounter for screening mammogram for malignant neoplasm of breast: Secondary | ICD-10-CM | POA: Diagnosis not present

## 2017-05-01 ENCOUNTER — Other Ambulatory Visit: Payer: Self-pay | Admitting: *Deleted

## 2017-05-01 DIAGNOSIS — Z3041 Encounter for surveillance of contraceptive pills: Secondary | ICD-10-CM

## 2017-05-01 MED ORDER — NORETHIN ACE-ETH ESTRAD-FE 1-20 MG-MCG(24) PO TABS: 1.0000 | ORAL_TABLET | Freq: Every day | ORAL | 12 refills | Status: DC

## 2017-05-01 NOTE — Telephone Encounter (Signed)
[facility] pharmacy called to verify pt's RX for OCP.  It was renewed by Dr Penne LashLeggett 03/13/17 but for some reason it had expired per the pharmacist.  RX renewed.

## 2018-05-14 ENCOUNTER — Encounter: Payer: Self-pay | Admitting: Obstetrics & Gynecology

## 2018-05-14 ENCOUNTER — Ambulatory Visit (INDEPENDENT_AMBULATORY_CARE_PROVIDER_SITE_OTHER): Payer: Managed Care, Other (non HMO) | Admitting: Obstetrics & Gynecology

## 2018-05-14 VITALS — BP 124/86 | HR 87 | Ht 67.0 in | Wt 114.0 lb

## 2018-05-14 DIAGNOSIS — Z01411 Encounter for gynecological examination (general) (routine) with abnormal findings: Secondary | ICD-10-CM

## 2018-05-14 DIAGNOSIS — N912 Amenorrhea, unspecified: Secondary | ICD-10-CM

## 2018-05-14 MED ORDER — NORETHIN ACE-ETH ESTRAD-FE 1-20 MG-MCG(24) PO TABS: 1.0000 | ORAL_TABLET | Freq: Every day | ORAL | 11 refills | Status: DC

## 2018-05-14 NOTE — Progress Notes (Signed)
Pt had missed period in November. Home pregnancy test was negative. PT's OCP was changed the beginning of November.   Last pap 01/20/46- negative Last mammogram- 03/22/17- negative  First BP 136/94. Repeat BP 124/86

## 2018-05-14 NOTE — Progress Notes (Signed)
Subjective:     Erika Garza is a 42 y.o. female here for a routine exam.  Current complaints: amenorrhea.  Did not get menses end of November.  She is on OCP and has not missed a pill  No N/V, some breast tenderness.  HTN today with automatic cuff..  Better with manual cuff (this happens at most MD offices)   Gynecologic History Patient's last menstrual period was 03/29/2018. Contraception: OCP (estrogen/progesterone) Last Pap: 207. Results were: normal Last mammogram: 2018. Results were: normal  Obstetric History OB History  Gravida Para Term Preterm AB Living  5 3 3  0 2 3  SAB TAB Ectopic Multiple Live Births  2 0 0 0 3    # Outcome Date GA Lbr Len/2nd Weight Sex Delivery Anes PTL Lv  5 Term 08/10/11 [id number]  7 lb 4.2 oz (3.295 kg) F CS-LTranv Spinal  LIV  4 SAB 09/13/10          3 SAB 03/14/10          2 Term 03/14/08 492w0d   M VBAC EPI  LIV  1 Term 11/12/05 402w0d   M CS-Unspec EPI N LIV     The following portions of the patient's history were reviewed and updated as appropriate: allergies, current medications, past family history, past medical history, past social history, past surgical history and problem list.  Review of Systems Pertinent items noted in HPI and remainder of comprehensive ROS otherwise negative.    Objective:      Vitals:   05/14/18 1000  BP: 124/86  Pulse: 87  Weight: 114 lb (51.7 kg)  Height: 5\' 7"  (1.702 m)   Vitals:  WNL General appearance: alert, cooperative and no distress  HEENT: Normocephalic, without obvious abnormality, atraumatic Eyes: negative Throat: lips, mucosa, and tongue normal; teeth and gums normal  Respiratory: Clear to auscultation bilaterally  CV: Regular rate and rhythm  Breasts:  Normal appearance, no masses or tenderness, no nipple retraction or dimpling  GI: Soft, non-tender; bowel sounds normal; no masses,  no organomegaly  GU: External Genitalia:  Tanner V, no lesion Urethra:  No prolapse   Vagina: Pink, normal  rugae, no blood or discharge  Cervix: No CMT, no lesion  Uterus:  Normal size and contour, non tender  Adnexa: Normal, no masses, non tender  Musculoskeletal: No edema, redness or tenderness in the calves or thighs  Skin: No lesions or rash  Lymphatic: Axillary adenopathy: none     Psychiatric: Normal mood and behavior        Assessment:    Healthy female exam.   Amenorrhea   Plan:  Check Beta HCG Pap and mammo next year TSH, CBC, CMP, Vit D, Chol BP nml with right size cuff (manual)  renew OCP (1/24)

## 2018-05-15 ENCOUNTER — Telehealth: Payer: Self-pay

## 2018-05-15 LAB — VITAMIN D 25 HYDROXY (VIT D DEFICIENCY, FRACTURES): VIT D 25 HYDROXY: 33 ng/mL (ref 30–100)

## 2018-05-15 LAB — CBC
HCT: 39.6 % (ref 35.0–45.0)
HEMOGLOBIN: 13.4 g/dL (ref 11.7–15.5)
MCH: 30.5 pg (ref 27.0–33.0)
MCHC: 33.8 g/dL (ref 32.0–36.0)
MCV: 90 fL (ref 80.0–100.0)
MPV: 10 fL (ref 7.5–12.5)
PLATELETS: 230 10*3/uL (ref 140–400)
RBC: 4.4 10*6/uL (ref 3.80–5.10)
RDW: 12 % (ref 11.0–15.0)
WBC: 6.1 10*3/uL (ref 3.8–10.8)

## 2018-05-15 LAB — COMPREHENSIVE METABOLIC PANEL
AG RATIO: 1.6 (calc) (ref 1.0–2.5)
ALT: 26 U/L (ref 6–29)
AST: 24 U/L (ref 10–30)
Albumin: 4.2 g/dL (ref 3.6–5.1)
Alkaline phosphatase (APISO): 42 U/L (ref 33–115)
BUN: 10 mg/dL (ref 7–25)
CO2: 24 mmol/L (ref 20–32)
CREATININE: 0.92 mg/dL (ref 0.50–1.10)
Calcium: 9.2 mg/dL (ref 8.6–10.2)
Chloride: 108 mmol/L (ref 98–110)
GLOBULIN: 2.6 g/dL (ref 1.9–3.7)
GLUCOSE: 79 mg/dL (ref 65–99)
Potassium: 3.8 mmol/L (ref 3.5–5.3)
SODIUM: 141 mmol/L (ref 135–146)
TOTAL PROTEIN: 6.8 g/dL (ref 6.1–8.1)
Total Bilirubin: 0.4 mg/dL (ref 0.2–1.2)

## 2018-05-15 LAB — HCG, QUANTITATIVE, PREGNANCY: HCG, Total, QN: <2 m[IU]/mL

## 2018-05-15 LAB — LIPID PANEL
CHOL/HDL RATIO: 2.2 (calc) (ref <5.0)
Cholesterol: 163 mg/dL (ref <200)
HDL: 75 mg/dL (ref >50)
LDL Cholesterol (Calc): 72 mg/dL (calc)
NON-HDL CHOLESTEROL (CALC): 88 mg/dL (ref <130)
Triglycerides: 81 mg/dL (ref <150)

## 2018-05-15 LAB — TSH: TSH: 1.4 m[IU]/L

## 2018-05-15 NOTE — Telephone Encounter (Addendum)
Spoke with pt and she is aware of normal labs  ----- Message from Lesly DukesKelly H Leggett, MD sent at 05/15/2018  1:49 PM EST ----- All your labs look good!

## 2018-06-04 ENCOUNTER — Encounter: Payer: Self-pay | Admitting: Obstetrics & Gynecology

## 2018-06-04 ENCOUNTER — Ambulatory Visit: Payer: Managed Care, Other (non HMO) | Admitting: Obstetrics & Gynecology

## 2018-06-04 VITALS — BP 126/84 | HR 76 | Ht 67.0 in | Wt 114.0 lb

## 2018-06-04 DIAGNOSIS — R636 Underweight: Secondary | ICD-10-CM

## 2018-06-04 DIAGNOSIS — Z3009 Encounter for other general counseling and advice on contraception: Secondary | ICD-10-CM | POA: Diagnosis not present

## 2018-06-04 NOTE — Progress Notes (Signed)
43 year old female presents with her husband discuss birth control options.  After her last month's pregnancy scare, they realize they would like birth control that is more effective than oral contraceptives.  Patient does have a history of osteoporosis and has a low BMI.  The birth control pills give her necessary estrogen as well as prevent pregnancy.  We discussed a Mirena IUD, female sterilization, female sterilization.  Erika Garza and her husband have chosen for vasectomy.  Patient will continue on oral contraceptives to protect her bone health.  She does not have any medical diagnoses that preclude her from being on oral contraceptives in her 9s.  If she develops chronic hypertension she will need to be removed from estrogen-containing birth control pills.  She is up-to-date on her mammogram and Pap smear.  She also had labs drawn and these are all normal as well.  25 spent face-to-face with patient with greater than 50% counseling.

## 2019-04-03 ENCOUNTER — Telehealth: Payer: Self-pay

## 2019-04-03 DIAGNOSIS — Z789 Other specified health status: Secondary | ICD-10-CM

## 2019-04-03 MED ORDER — LARIN 24 FE 1-20 MG-MCG(24) PO TABS: 1.0000 | ORAL_TABLET | Freq: Every day | ORAL | 0 refills | Status: DC

## 2019-04-03 NOTE — Telephone Encounter (Signed)
Pt has scheduled annual for 06/03/19 and will run out of OCP until then. Refill sent to pharmacy until she comes in for annual on 06/03/19.

## 2019-04-16 ENCOUNTER — Other Ambulatory Visit: Payer: Self-pay | Admitting: Obstetrics & Gynecology

## 2019-04-16 DIAGNOSIS — Z789 Other specified health status: Secondary | ICD-10-CM

## 2019-04-17 ENCOUNTER — Other Ambulatory Visit: Payer: Self-pay | Admitting: *Deleted

## 2019-04-17 DIAGNOSIS — Z789 Other specified health status: Secondary | ICD-10-CM

## 2019-04-17 MED ORDER — LARIN 24 FE 1-20 MG-MCG(24) PO TABS: 1.0000 | ORAL_TABLET | Freq: Every day | ORAL | 0 refills | Status: DC

## 2019-04-17 NOTE — Telephone Encounter (Signed)
Pt called requesting a RF on her [facility] which she gets 90 day supply.  She has appt in January for annual.  RF sent to [facility].

## 2019-05-01 ENCOUNTER — Other Ambulatory Visit: Payer: Self-pay | Admitting: Obstetrics & Gynecology

## 2019-05-01 DIAGNOSIS — Z1231 Encounter for screening mammogram for malignant neoplasm of breast: Secondary | ICD-10-CM

## 2019-05-02 ENCOUNTER — Other Ambulatory Visit: Payer: Self-pay

## 2019-05-02 ENCOUNTER — Ambulatory Visit (INDEPENDENT_AMBULATORY_CARE_PROVIDER_SITE_OTHER): Payer: Managed Care, Other (non HMO)

## 2019-05-02 ENCOUNTER — Other Ambulatory Visit: Payer: Self-pay | Admitting: Obstetrics & Gynecology

## 2019-05-02 DIAGNOSIS — Z1231 Encounter for screening mammogram for malignant neoplasm of breast: Secondary | ICD-10-CM | POA: Diagnosis not present

## 2019-05-02 DIAGNOSIS — R928 Other abnormal and inconclusive findings on diagnostic imaging of breast: Secondary | ICD-10-CM

## 2019-05-03 ENCOUNTER — Ambulatory Visit
Admission: RE | Admit: 2019-05-03 | Discharge: 2019-05-03 | Disposition: A | Discharge status: Home / Self Care | Payer: Managed Care, Other (non HMO) | Source: Ambulatory Visit | Attending: Obstetrics & Gynecology | Admitting: Obstetrics & Gynecology

## 2019-05-03 ENCOUNTER — Ambulatory Visit: Payer: Managed Care, Other (non HMO)

## 2019-05-03 DIAGNOSIS — R928 Other abnormal and inconclusive findings on diagnostic imaging of breast: Secondary | ICD-10-CM

## 2019-05-20 ENCOUNTER — Ambulatory Visit: Payer: Managed Care, Other (non HMO) | Admitting: Obstetrics & Gynecology

## 2019-05-30 ENCOUNTER — Encounter

## 2019-06-03 ENCOUNTER — Encounter: Payer: Self-pay | Admitting: Obstetrics & Gynecology

## 2019-06-03 ENCOUNTER — Ambulatory Visit (INDEPENDENT_AMBULATORY_CARE_PROVIDER_SITE_OTHER): Payer: Managed Care, Other (non HMO) | Admitting: Obstetrics & Gynecology

## 2019-06-03 ENCOUNTER — Other Ambulatory Visit: Payer: Self-pay

## 2019-06-03 VITALS — BP 128/85 | HR 80 | Resp 16 | Ht 67.0 in | Wt 112.0 lb

## 2019-06-03 DIAGNOSIS — Z0000 Encounter for general adult medical examination without abnormal findings: Secondary | ICD-10-CM

## 2019-06-03 DIAGNOSIS — Z1151 Encounter for screening for human papillomavirus (HPV): Secondary | ICD-10-CM | POA: Diagnosis not present

## 2019-06-03 DIAGNOSIS — Z124 Encounter for screening for malignant neoplasm of cervix: Secondary | ICD-10-CM

## 2019-06-03 DIAGNOSIS — Z789 Other specified health status: Secondary | ICD-10-CM | POA: Diagnosis not present

## 2019-06-03 MED ORDER — LARIN 24 FE 1-20 MG-MCG(24) PO TABS: 1.0000 | ORAL_TABLET | Freq: Every day | ORAL | 4 refills | Status: DC

## 2019-06-03 NOTE — Progress Notes (Signed)
Subjective:     Erika Garza is a 44 y.o. female here for a routine exam.  Current complaints: decreased libido and might be due change in generic OPCs.  She is wanting to go back to her prior generic.  Will try [facility] another [facility].     Gynecologic History Patient's last menstrual period was 05/03/2019. Contraception: OCP (estrogen/progesterone) Last Pap: 2017. Results were: normal Last mammogram: 2020. Results were: normal  Obstetric History OB History  Gravida Para Term Preterm AB Living  5 3 3  0 2 3  SAB TAB Ectopic Multiple Live Births  2 0 0 0 3    # Outcome Date GA Lbr Len/2nd Weight Sex Delivery Anes PTL Lv  5 Term 08/10/11 [redacted]w[redacted]d  7 lb 4.2 oz (3.295 kg) F CS-LTranv Spinal  LIV  4 SAB 09/13/10          3 SAB 03/14/10          2 Term 03/14/08 [redacted]w[redacted]d   M VBAC EPI  LIV  1 Term 11/12/05 [redacted]w[redacted]d   M CS-Unspec EPI N LIV     The following portions of the patient's history were reviewed and updated as appropriate: allergies, current medications, past family history, past medical history, past social history, past surgical history and problem list.  Review of Systems Pertinent items noted in HPI and remainder of comprehensive ROS otherwise negative.    Objective:      Vitals:   06/03/19 0803  BP: 128/85  Pulse: 80  Resp: 16  Weight: 112 lb (50.8 kg)  Height: 5\' 7"  (1.702 m)   Vitals:  WNL General appearance: alert, cooperative and no distress  HEENT: Normocephalic, without obvious abnormality, atraumatic Eyes: negative Throat: lips, mucosa, and tongue normal; teeth and gums normal  Respiratory: Clear to auscultation bilaterally  CV: Regular rate and rhythm  Breasts:  Normal appearance, no masses or tenderness, no nipple retraction or dimpling  GI: Soft, non-tender; bowel sounds normal; no masses,  no organomegaly  GU: External Genitalia:  Tanner V, no lesion Urethra:  No prolapse   Vagina: Pink, normal rugae, no blood or discharge  Cervix: No CMT, no  lesion  Uterus:  Normal size and contour, non tender  Adnexa: Normal, no masses, non tender  Musculoskeletal: No edema, redness or tenderness in the calves or thighs  Skin: No lesions or rash  Lymphatic: Axillary adenopathy: none     Psychiatric: Normal mood and behavior        Assessment:    Healthy female exam.   Low BMI--not trying to lose weight.  Stable recently.      Plan:   Pap with cotesting Check cbc, cmp, tsh, vit D and cholesterol Yearly mammograms Pt received Covid vaccine x1 shot so far Ensure good dietary intake of calcium and vit D

## 2019-06-04 LAB — LIPID PANEL
Cholesterol: 159 mg/dL (ref <200)
HDL: 76 mg/dL (ref >=50)
LDL Cholesterol (Calc): 65 mg/dL (calc)
Non-HDL Cholesterol (Calc): 83 mg/dL (calc) (ref <130)
Total CHOL/HDL Ratio: 2.1 (calc) (ref <5.0)
Triglycerides: 95 mg/dL (ref <150)

## 2019-06-04 LAB — CBC
HCT: 44.3 % (ref 35.0–45.0)
Hemoglobin: 14.8 g/dL (ref 11.7–15.5)
MCH: 29.2 pg (ref 27.0–33.0)
MCHC: 33.4 g/dL (ref 32.0–36.0)
MCV: 87.4 fL (ref 80.0–100.0)
MPV: 10.4 fL (ref 7.5–12.5)
Platelets: 209 10*3/uL (ref 140–400)
RBC: 5.07 10*6/uL (ref 3.80–5.10)
RDW: 12.3 % (ref 11.0–15.0)
WBC: 6.1 10*3/uL (ref 3.8–10.8)

## 2019-06-04 LAB — COMPREHENSIVE METABOLIC PANEL
AG Ratio: 1.6 (calc) (ref 1.0–2.5)
ALT: 23 U/L (ref 6–29)
AST: 26 U/L (ref 10–30)
Albumin: 4.3 g/dL (ref 3.6–5.1)
Alkaline phosphatase (APISO): 48 U/L (ref 31–125)
BUN: 12 mg/dL (ref 7–25)
CO2: 24 mmol/L (ref 20–32)
Calcium: 9.5 mg/dL (ref 8.6–10.2)
Chloride: 103 mmol/L (ref 98–110)
Creat: 0.97 mg/dL (ref 0.50–1.10)
Globulin: 2.7 g/dL (calc) (ref 1.9–3.7)
Glucose, Bld: 85 mg/dL (ref 65–99)
Potassium: 4.5 mmol/L (ref 3.5–5.3)
Sodium: 138 mmol/L (ref 135–146)
Total Bilirubin: 0.4 mg/dL (ref 0.2–1.2)
Total Protein: 7 g/dL (ref 6.1–8.1)

## 2019-06-04 LAB — TSH: TSH: 3.71 mIU/L

## 2019-06-04 LAB — VITAMIN D 25 HYDROXY (VIT D DEFICIENCY, FRACTURES): Vit D, 25-Hydroxy: 27 ng/mL — ABNORMAL LOW (ref 30–100)

## 2019-06-05 LAB — CYTOLOGY - PAP
Comment: NEGATIVE
Diagnosis: NEGATIVE
High risk HPV: NEGATIVE

## 2019-09-10 ENCOUNTER — Other Ambulatory Visit (INDEPENDENT_AMBULATORY_CARE_PROVIDER_SITE_OTHER): Payer: Managed Care, Other (non HMO) | Admitting: *Deleted

## 2019-09-10 ENCOUNTER — Other Ambulatory Visit: Payer: Self-pay

## 2019-09-10 DIAGNOSIS — E559 Vitamin D deficiency, unspecified: Secondary | ICD-10-CM | POA: Diagnosis not present

## 2019-09-10 NOTE — Progress Notes (Signed)
Vit D lab recheck only

## 2019-09-11 LAB — VITAMIN D 25 HYDROXY (VIT D DEFICIENCY, FRACTURES): Vit D, 25-Hydroxy: 72 ng/mL (ref 30–100)

## 2020-05-22 IMAGING — MG MM DIGITAL DIAGNOSTIC UNILAT*R* W/ TOMO W/ CAD
4 series · 4 of 12 positions shown · non-contrast
Comparison: 05/02/2019 and earlier

CLINICAL DATA: Patient returns after screening study for evaluation
of possible RIGHT breast mass.

EXAM:
DIGITAL DIAGNOSTIC UNILATERAL RIGHT MAMMOGRAM WITH CAD AND TOMO

[R CC synth-2D]
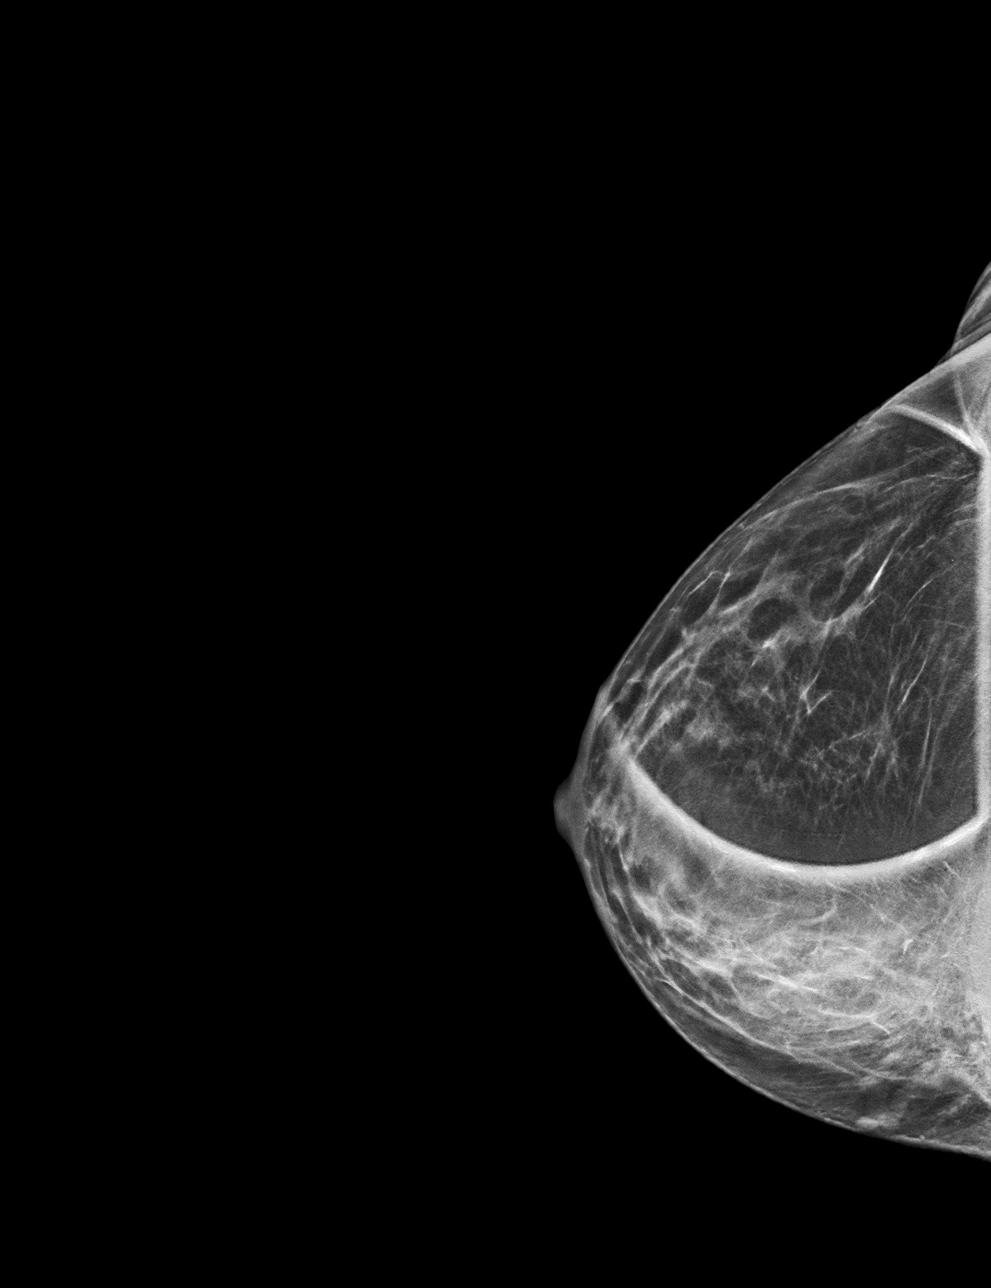

[R MLO synth-2D]
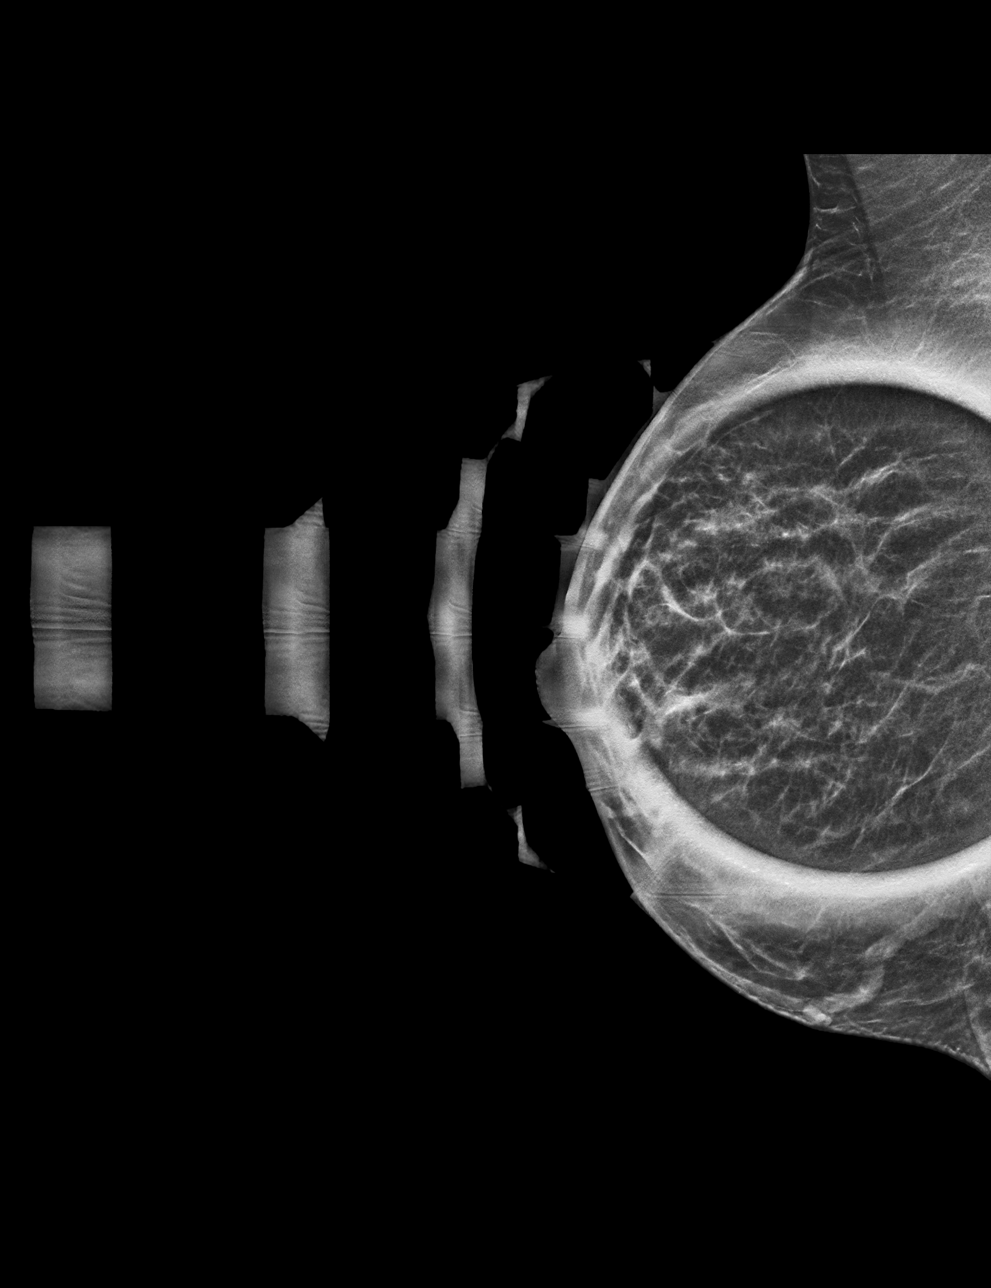

[R CC tomo · tomo slice 21/42.0]
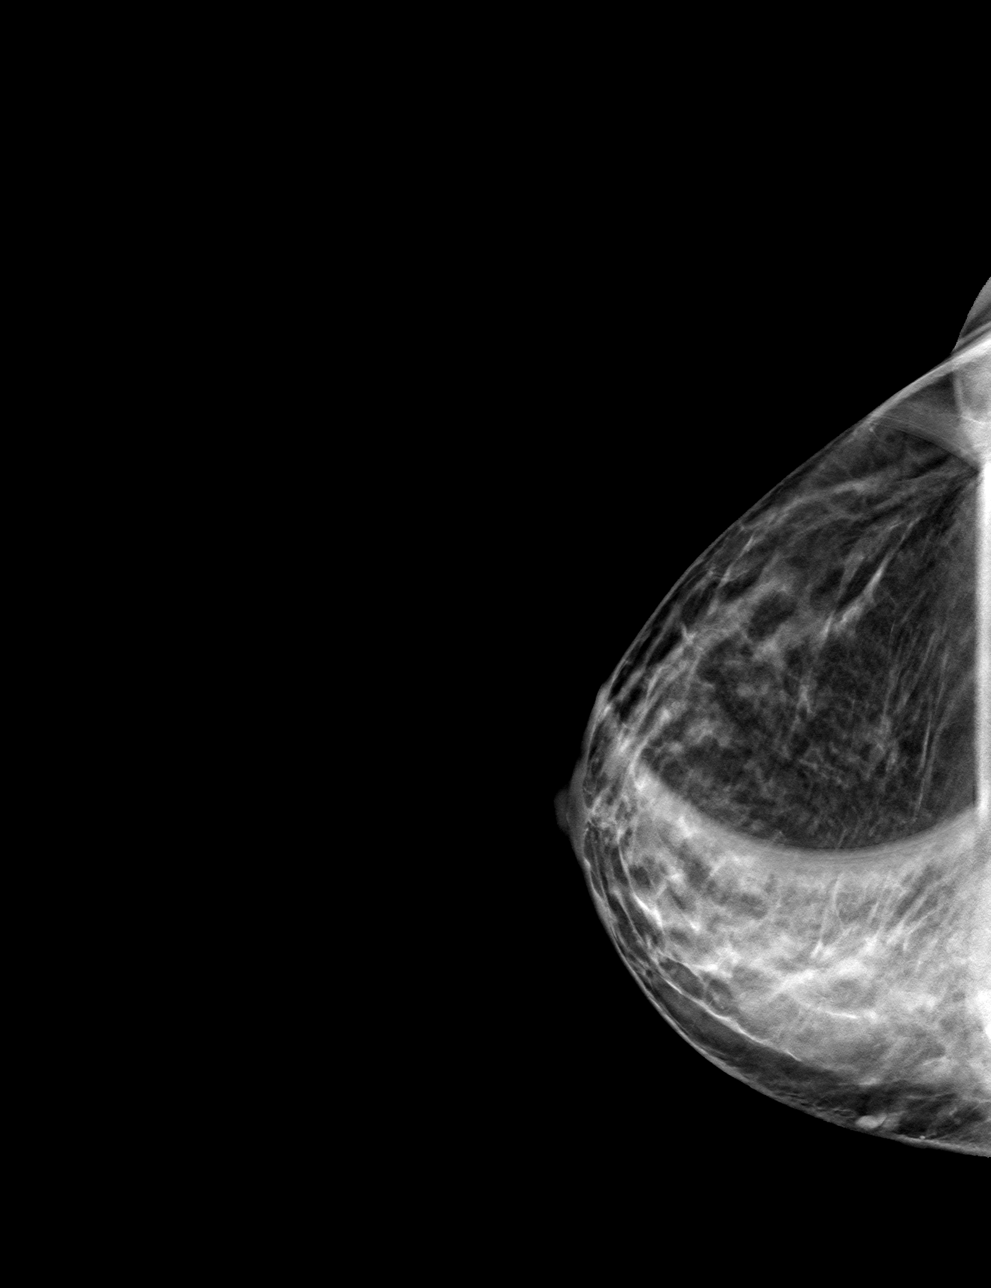

[R MLO tomo · tomo slice 19/37.0]
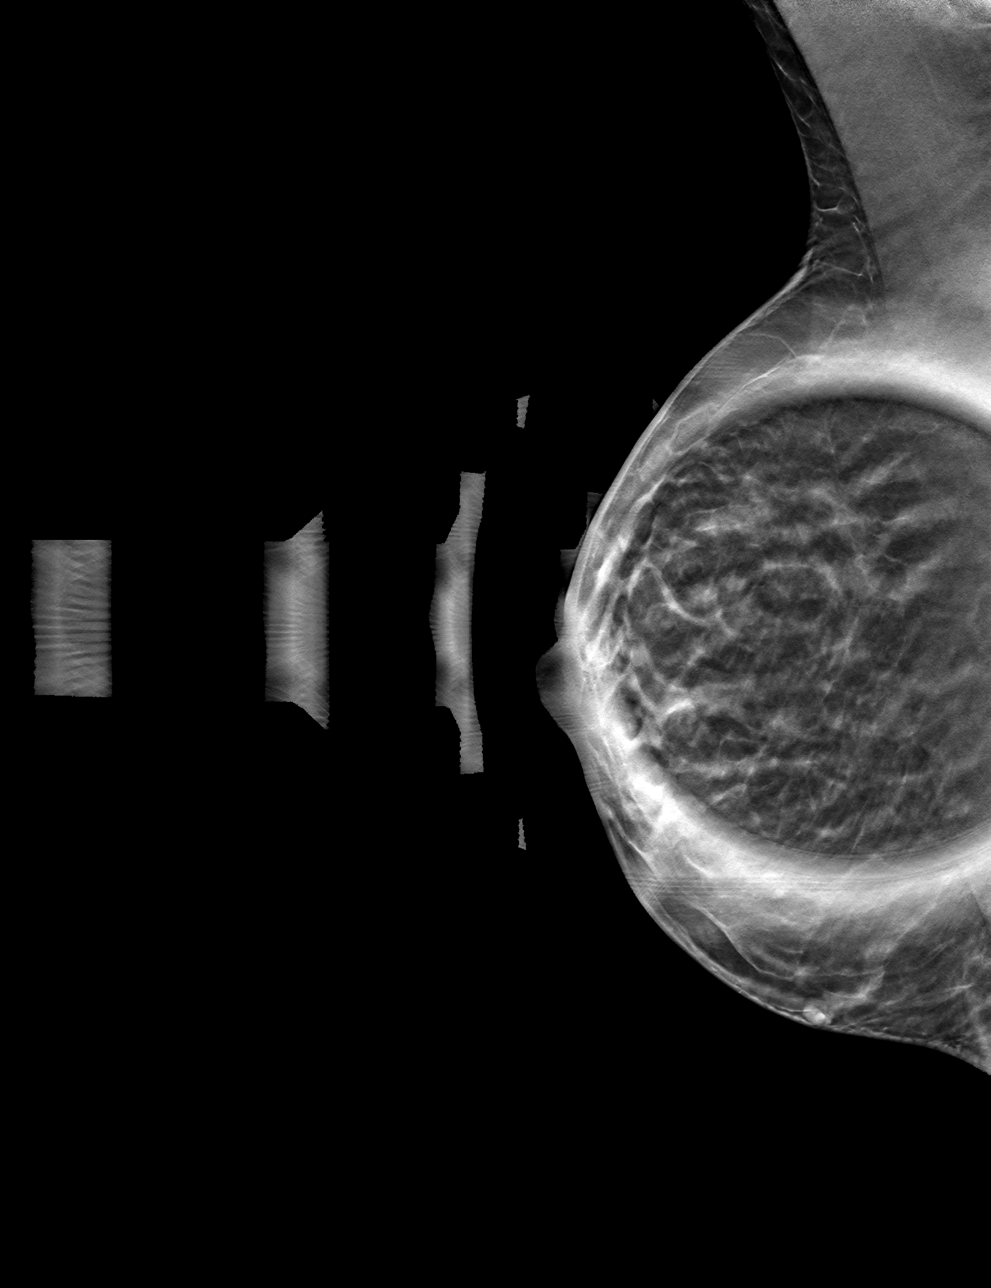

[4 of 12 positions shown; findings below may reference images not displayed]

ACR Breast Density Category b: There are scattered areas of
fibroglandular density.
FINDINGS: Additional 2-D and 3-D images are performed. No persistent mass or
distortion identified in the UPPER-OUTER QUADRANT of the RIGHT
breast. No suspicious mass, distortion, or microcalcifications are
identified to suggest presence of malignancy.

Mammographic images were processed with CAD.
IMPRESSION: No mammographic evidence for malignancy.

RECOMMENDATION:
Screening mammogram in one year.(Code:[id number]-[id number])

I have discussed the findings and recommendations with the patient.
If applicable, a reminder letter will be sent to the patient
regarding the next appointment.

BI-RADS CATEGORY  1: Negative.

## 2020-06-04 ENCOUNTER — Other Ambulatory Visit: Payer: Self-pay | Admitting: *Deleted

## 2020-06-04 DIAGNOSIS — Z789 Other specified health status: Secondary | ICD-10-CM

## 2020-06-04 MED ORDER — LARIN 24 FE 1-20 MG-MCG(24) PO TABS: 1.0000 | ORAL_TABLET | Freq: Every day | ORAL | 0 refills | Status: DC

## 2020-06-04 NOTE — Telephone Encounter (Signed)
Pt called requesting a RF on her OCP's.  She has appt with Dr Penne Lash on 07/27/20 for her annual.  RX sent to [facility] enough to last until her annual.

## 2020-07-27 ENCOUNTER — Ambulatory Visit (INDEPENDENT_AMBULATORY_CARE_PROVIDER_SITE_OTHER): Payer: Managed Care, Other (non HMO) | Admitting: Obstetrics & Gynecology

## 2020-07-27 ENCOUNTER — Other Ambulatory Visit: Payer: Self-pay

## 2020-07-27 ENCOUNTER — Encounter: Payer: Self-pay | Admitting: Obstetrics & Gynecology

## 2020-07-27 VITALS — BP 133/92 | HR 92 | Resp 16 | Ht 67.0 in | Wt 116.0 lb

## 2020-07-27 DIAGNOSIS — Z01411 Encounter for gynecological examination (general) (routine) with abnormal findings: Secondary | ICD-10-CM

## 2020-07-27 DIAGNOSIS — Z789 Other specified health status: Secondary | ICD-10-CM

## 2020-07-27 DIAGNOSIS — M818 Other osteoporosis without current pathological fracture: Secondary | ICD-10-CM

## 2020-07-27 DIAGNOSIS — R636 Underweight: Secondary | ICD-10-CM

## 2020-07-27 MED ORDER — LARIN 24 FE 1-20 MG-MCG(24) PO TABS: 1.0000 | ORAL_TABLET | Freq: Every day | ORAL | 3 refills | Status: DC

## 2020-07-27 NOTE — Progress Notes (Signed)
Subjective:     Erika Garza is a 45 y.o. female here for a routine exam.  Current complaints: none.  Doing well on OCPs.  Light menses.  +osteoporosis with plan to stay on OCPs until 49.  Saw osteoporosis specialist at [facility] (where BMD was done).    Gynecologic History Patient's last menstrual period was 07/16/2020. Contraception: OCP (estrogen/progesterone) and vasectomy Last Pap: 2021. Results were: normal Last mammogram: 2021. Results were: normal; had to come back for additional images Obstetric History OB History  Gravida Para Term Preterm AB Living  5 3 3  0 2 3  SAB IAB Ectopic Multiple Live Births  2 0 0 0 3    # Outcome Date GA Lbr Len/2nd Weight Sex Delivery Anes PTL Lv  5 Term 08/10/11 [redacted]w[redacted]d  7 lb 4.2 oz (3.295 kg) F CS-LTranv Spinal  LIV  4 SAB 09/13/10          3 SAB 03/14/10          2 Term 03/14/08 [redacted]w[redacted]d   M VBAC EPI  LIV  1 Term 11/12/05 [redacted]w[redacted]d   M CS-Unspec EPI N LIV     The following portions of the patient's history were reviewed and updated as appropriate: allergies, current medications, past family history, past medical history, past social history, past surgical history and problem list.  Review of Systems Pertinent items noted in HPI and remainder of comprehensive ROS otherwise negative.    Objective:      Vitals:   07/27/20 0754  BP: (!) 133/92  Pulse: 92  Resp: 16  Weight: 116 lb (52.6 kg)  Height: 5\' 7"  (1.702 m)   Vitals:  WNL General appearance: alert, cooperative and no distress  HEENT: Normocephalic, without obvious abnormality, atraumatic Eyes: negative Throat: lips, mucosa, and tongue normal; teeth and gums normal  Respiratory: Clear to auscultation bilaterally  CV: Regular rate and rhythm  Breasts:  Normal appearance, no masses or tenderness, no nipple retraction or dimpling  GI: Soft, non-tender; bowel sounds normal; no masses,  no organomegaly  GU: External Genitalia:  Tanner V, no lesion Urethra:  No prolapse    Vagina: Pink, normal rugae, no blood or discharge  Cervix: No CMT, no lesion  Uterus:  Normal size and contour, non tender  Adnexa: Normal, no masses, non tender  Musculoskeletal: No edema, redness or tenderness in the calves or thighs  Skin: No lesions or rash  Lymphatic: Axillary adenopathy: none     Psychiatric: Normal mood and behavior        Assessment:    Healthy female exam.    Plan:   1.  Yearly mammograms 2.  Pap not due until 2024 3.  Continue OCPs.  Will stop at age 89 4.  Cont. Vit D and Calcium supplements. 5.  BP slightly elevated.  Pt to take at home and see PCP if remains elevated.

## 2021-09-13 ENCOUNTER — Ambulatory Visit (INDEPENDENT_AMBULATORY_CARE_PROVIDER_SITE_OTHER): Payer: Managed Care, Other (non HMO) | Admitting: Obstetrics & Gynecology

## 2021-09-13 ENCOUNTER — Encounter: Payer: Self-pay | Admitting: Obstetrics & Gynecology

## 2021-09-13 VITALS — BP 134/91 | HR 80 | Ht 67.0 in | Wt 116.0 lb

## 2021-09-13 DIAGNOSIS — M810 Age-related osteoporosis without current pathological fracture: Secondary | ICD-10-CM | POA: Diagnosis not present

## 2021-09-13 DIAGNOSIS — Z01419 Encounter for gynecological examination (general) (routine) without abnormal findings: Secondary | ICD-10-CM

## 2021-09-13 DIAGNOSIS — Z01411 Encounter for gynecological examination (general) (routine) with abnormal findings: Secondary | ICD-10-CM

## 2021-09-13 LAB — CBC
HCT: 45.9 % — ABNORMAL HIGH (ref 35.0–45.0)
Hemoglobin: 15.1 g/dL (ref 11.7–15.5)
MCH: 29 pg (ref 27.0–33.0)
MCHC: 32.9 g/dL (ref 32.0–36.0)
MCV: 88.3 fL (ref 80.0–100.0)
MPV: 10.5 fL (ref 7.5–12.5)
Platelets: 243 10*3/uL (ref 140–400)
RBC: 5.2 10*6/uL — ABNORMAL HIGH (ref 3.80–5.10)
RDW: 12.6 % (ref 11.0–15.0)
WBC: 6.6 10*3/uL (ref 3.8–10.8)

## 2021-09-13 NOTE — Progress Notes (Signed)
Last Mammogram: 06/10/21 ?Last Pap Smear:  06/03/19 ?Last Colon Screening;  Never done  ?Seat Belts:   Yes ?Wynelle Link Screen:   Yes ?Dental Check Up:  Yes ?Brush & Floss:  Yes  ?

## 2021-09-13 NOTE — Progress Notes (Signed)
Subjective:  ?  ? Erika Garza is a 46 y.o. female here for a routine exam.  Current complaints: none.   ?  ?Gynecologic History ?No LMP recorded. ?Contraception: OCP (estrogen/progesterone) ?Last Mammogram: 06/10/21--normal ?Last Pap Smear:  06/03/19--normal ?Last Colon Screening;  Never done  ?Seat Belts:   Yes ?Wynelle Link Screen:   Yes ?Dental Check Up:  Yes ?Brush & Floss:  Yes  ? ?Obstetric History ?OB History  ?Gravida Para Term Preterm AB Living  ?5 3 3  0 2 3  ?SAB IAB Ectopic Multiple Live Births  ?2 0 0 0 3  ?  ?# Outcome Date GA Lbr Len/2nd Weight Sex Delivery Anes PTL Lv  ?5 Term 08/10/11 [redacted]w[redacted]d  7 lb 4.2 oz (3.295 kg) F CS-LTranv Spinal  LIV  ?4 SAB 09/13/10          ?3 SAB 03/14/10          ?2 Term 03/14/08 [redacted]w[redacted]d   M VBAC EPI  LIV  ?1 Term 11/12/05 [redacted]w[redacted]d   M CS-Unspec EPI N LIV  ? ? ? ?The following portions of the patient's history were reviewed and updated as appropriate: allergies, current medications, past family history, past medical history, past social history, past surgical history, and problem list. ? ?Review of Systems ?Pertinent items noted in HPI and remainder of comprehensive ROS otherwise negative.  ?  ?Objective:  ? ?  ?Vitals:  ? 09/13/21 0810  ?BP: (!) 134/91  ?Pulse: 80  ?Weight: 116 lb (52.6 kg)  ?Height: 5\' 7"  (1.702 m)  ? ?Vitals:  WNL ?General appearance: alert, cooperative and no distress ? ?HEENT: Normocephalic, without obvious abnormality, atraumatic ?Eyes: negative ?Throat: lips, mucosa, and tongue normal; teeth and gums normal  ?Respiratory: Clear to auscultation bilaterally  ?CV: Regular rate and rhythm  ?Breasts:  Normal appearance, no masses or tenderness, no nipple retraction or dimpling  ?GI: Soft, non-tender; bowel sounds normal; no masses,  no organomegaly  ?GU: External Genitalia:  Tanner V, no lesion ?Urethra:  No prolapse   ?Vagina: Pink, normal rugae, no blood or discharge  ?Cervix: No CMT, no lesion  ?Uterus:  Normal size and contour, non tender  ?Adnexa:  Normal, no masses, non tender  ?Musculoskeletal: No edema, redness or tenderness in the calves or thighs  ?Skin: No lesions or rash  ?Lymphatic: Axillary adenopathy: none     ?Psychiatric: Normal mood and behavior  ? ? ?   ? ?Assessment:  ? ? Healthy female exam.  ?  ?Plan:  ? ? Pap smear not due ?Yearly mammograms ?Colon Cancer Screening--colonscopy ?BP mildly elevated.  Pt takes at home and not elevated ?Osteoporoisis--followed at outside office for this.  Will check vitamin d level.  ?Continue OCPs--plan is until 46 yo.  ?Well woman labs ordered.  ?

## 2021-09-14 ENCOUNTER — Encounter: Payer: Self-pay | Admitting: Obstetrics & Gynecology

## 2021-09-14 DIAGNOSIS — R7989 Other specified abnormal findings of blood chemistry: Secondary | ICD-10-CM | POA: Insufficient documentation

## 2021-09-14 LAB — LIPID PANEL
Cholesterol: 196 mg/dL (ref <200)
HDL: 86 mg/dL (ref >=50)
LDL Cholesterol (Calc): 92 mg/dL (calc)
Non-HDL Cholesterol (Calc): 110 mg/dL (calc) (ref <130)
Total CHOL/HDL Ratio: 2.3 (calc) (ref <5.0)
Triglycerides: 90 mg/dL (ref <150)

## 2021-09-14 LAB — COMPREHENSIVE METABOLIC PANEL
AG Ratio: 1.5 (calc) (ref 1.0–2.5)
ALT: 23 U/L (ref 6–29)
AST: 23 U/L (ref 10–35)
Albumin: 4.5 g/dL (ref 3.6–5.1)
Alkaline phosphatase (APISO): 59 U/L (ref 31–125)
BUN/Creatinine Ratio: 12 (calc) (ref 6–22)
BUN: 12 mg/dL (ref 7–25)
CO2: 25 mmol/L (ref 20–32)
Calcium: 10.2 mg/dL (ref 8.6–10.2)
Chloride: 107 mmol/L (ref 98–110)
Creat: 1.01 mg/dL — ABNORMAL HIGH (ref 0.50–0.99)
Globulin: 3 g/dL (calc) (ref 1.9–3.7)
Glucose, Bld: 86 mg/dL (ref 65–99)
Potassium: 4.5 mmol/L (ref 3.5–5.3)
Sodium: 140 mmol/L (ref 135–146)
Total Bilirubin: 0.5 mg/dL (ref 0.2–1.2)
Total Protein: 7.5 g/dL (ref 6.1–8.1)

## 2021-09-14 LAB — ESTRADIOL: Estradiol: <15 pg/mL

## 2021-09-14 LAB — TSH: TSH: 2.13 mIU/L

## 2021-09-14 LAB — VITAMIN D 25 HYDROXY (VIT D DEFICIENCY, FRACTURES): Vit D, 25-Hydroxy: 40 ng/mL (ref 30–100)

## 2021-10-06 ENCOUNTER — Other Ambulatory Visit: Payer: Self-pay | Admitting: *Deleted

## 2021-10-06 DIAGNOSIS — Z789 Other specified health status: Secondary | ICD-10-CM

## 2021-10-06 MED ORDER — LARIN 24 FE 1-20 MG-MCG(24) PO TABS: 1.0000 | ORAL_TABLET | Freq: Every day | ORAL | 3 refills | Status: DC

## 2021-12-14 ENCOUNTER — Encounter: Payer: Self-pay | Admitting: Obstetrics & Gynecology

## 2022-09-27 NOTE — Progress Notes (Signed)
Last Mammogram: 05/18/22- normal Last Pap Smear:  06/03/19- negative Last Colon Screening: About a year ago - normal ([facility])  Seat Belts:  Yes Sun Screen:   Yes Dental Check Up:  Yes Brush & Floss:  Yes

## 2022-10-03 ENCOUNTER — Encounter: Payer: Self-pay | Admitting: Obstetrics & Gynecology

## 2022-10-03 ENCOUNTER — Other Ambulatory Visit (HOSPITAL_COMMUNITY)
Admission: RE | Admit: 2022-10-03 | Discharge: 2022-10-03 | Disposition: A | Discharge status: Home / Self Care | Payer: Managed Care, Other (non HMO) | Source: Ambulatory Visit | Attending: Obstetrics & Gynecology | Admitting: Obstetrics & Gynecology

## 2022-10-03 ENCOUNTER — Ambulatory Visit: Payer: Managed Care, Other (non HMO) | Admitting: Obstetrics & Gynecology

## 2022-10-03 VITALS — BP 128/86 | HR 84 | Ht 66.0 in | Wt 115.0 lb

## 2022-10-03 DIAGNOSIS — Z01419 Encounter for gynecological examination (general) (routine) without abnormal findings: Secondary | ICD-10-CM | POA: Diagnosis present

## 2022-10-03 DIAGNOSIS — L02214 Cutaneous abscess of groin: Secondary | ICD-10-CM

## 2022-10-03 DIAGNOSIS — Z789 Other specified health status: Secondary | ICD-10-CM

## 2022-10-03 MED ORDER — LARIN 24 FE 1-20 MG-MCG(24) PO TABS: 1.0000 | ORAL_TABLET | Freq: Every day | ORAL | 3 refills | Status: DC

## 2022-10-03 NOTE — Progress Notes (Signed)
Subjective:     Erika Garza is a 47 y.o. female here for a routine exam.  Current complaints: right sided suspected in grown hair/boil--did not respond to doxy from PCP.    Gynecologic History No LMP recorded. (Menstrual status: Oral contraceptives). Contraception: OCP (estrogen/progesterone) Last Mammogram: 05/18/22- normal Last Pap Smear:  06/03/19- negative Last Colon Screening: About a year ago - normal ([facility])  Seat Belts:  Yes Sun Screen:   Yes Dental Check Up:  Yes Brush & Floss:  Yes   Obstetric History OB History  Gravida Para Term Preterm AB Living  5 3 3  0 2 3  SAB IAB Ectopic Multiple Live Births  2 0 0 0 3    # Outcome Date GA Lbr Len/2nd Weight Sex Delivery Anes PTL Lv  5 Term 08/10/11 [redacted]w[redacted]d  7 lb 4.2 oz (3.295 kg) F CS-LTranv Spinal  LIV  4 SAB 09/13/10          3 SAB 03/14/10          2 Term 03/14/08 [redacted]w[redacted]d   M VBAC EPI  LIV  1 Term 11/12/05 [redacted]w[redacted]d   M CS-Unspec EPI N LIV     The following portions of the patient's history were reviewed and updated as appropriate: allergies, current medications, past family history, past medical history, past social history, past surgical history, and problem list.  Review of Systems Pertinent items noted in HPI and remainder of comprehensive ROS otherwise negative.    Objective:     Vitals:   10/03/22 1312 10/03/22 1313  BP: (!) 128/90 128/86  Pulse: 84   Weight: 115 lb (52.2 kg)   Height: 5\' 6"  (1.676 m)    Vitals:  WNL General appearance: alert, cooperative and no distress  HEENT: Normocephalic, without obvious abnormality, atraumatic Eyes: negative Throat: lips, mucosa, and tongue normal; teeth and gums normal  Respiratory: Clear to auscultation bilaterally  CV: Regular rate and rhythm  Breasts:  Normal appearance, no masses or tenderness, no nipple retraction or dimpling  GI: Soft, non-tender; bowel sounds normal; no masses,  no organomegaly  GU: External Genitalia:  Tanner V, no  lesion Urethra:  No prolapse   Vagina: Pink, normal rugae, no blood or discharge  Cervix: No CMT, no lesion  Uterus:  Normal size and contour, non tender  Adnexa: Normal, no masses, non tender  Musculoskeletal: No edema, redness or tenderness in the calves or thighs  Skin: No lesions or rash  Lymphatic: Axillary adenopathy: none; ?enlarged lymph node in inguinal region near mons (right)     Psychiatric: Normal mood and behavior        Assessment:    Healthy female exam.  Mass right side inguinal near mons   Plan:   Pap with co testing Yearly mammograms If right mass still present in 2 weeks--send my chart message; will consider imagining (sonogram) or referral to general surgery.  Continue OCPs until 49.  Will d/c if becomes hypertensive.   Procedure: After informed consent was obtained patient was placed in dorsal supine position.  The area was cleaned with alcohol and injected with 1 cc of 1% plain lidocaine.  The area was cleaned with betadine.  A scalpel was used to incise the skin at the area of suspected abscess.  No no purulent material was obtained.  Bleeding was controlled with direct pressure.  A bandage was placed over the incision.

## 2022-10-05 LAB — CYTOLOGY - PAP
Comment: NEGATIVE
Diagnosis: NEGATIVE
High risk HPV: NEGATIVE

## 2022-11-14 ENCOUNTER — Ambulatory Visit: Payer: Managed Care, Other (non HMO) | Admitting: Obstetrics & Gynecology

## 2023-10-12 ENCOUNTER — Telehealth: Payer: Self-pay | Admitting: *Deleted

## 2023-10-12 NOTE — Telephone Encounter (Signed)
Left patient a message to call and schedule annual. 

## 2023-11-20 ENCOUNTER — Ambulatory Visit

## 2023-11-20 ENCOUNTER — Ambulatory Visit: Admitting: Obstetrics & Gynecology

## 2023-11-20 ENCOUNTER — Encounter: Payer: Self-pay | Admitting: Obstetrics & Gynecology

## 2023-11-20 VITALS — BP 133/89 | HR 84 | Ht 66.0 in | Wt 117.0 lb

## 2023-11-20 DIAGNOSIS — Z01419 Encounter for gynecological examination (general) (routine) without abnormal findings: Secondary | ICD-10-CM

## 2023-11-20 DIAGNOSIS — Z1331 Encounter for screening for depression: Secondary | ICD-10-CM

## 2023-11-20 DIAGNOSIS — R1032 Left lower quadrant pain: Secondary | ICD-10-CM

## 2023-11-20 NOTE — Progress Notes (Signed)
 Last pap smear (date and result):10/03/2022 WNL Last mammogram (date and result):05/17/2023 WNL Last colon screening (date and result):01/07/2022 Brush:yes Floss:yes Seatbelts: yes Sunscreen: yes     Subjective:     Erika Garza is a 48 y.o. female here for a routine exam.  Current complaints: Left lower quadrant pain a week ago..     Gynecologic History No LMP recorded (lmp unknown). (Menstrual status: Oral contraceptives). Contraception: OCP (estrogen/progesterone) Last pap smear (date and result):10/03/2022 WNL Last mammogram (date and result):05/17/2023 WNL Last colon screening (date and result):01/07/2022 Brush:yes Floss:yes Seatbelts: yes Sunscreen: yes  Obstetric History OB History  Gravida Para Term Preterm AB Living  5 3 3  0 2 3  SAB IAB Ectopic Multiple Live Births  2 0 0 0 3    # Outcome Date GA Lbr Len/2nd Weight Sex Type Anes PTL Lv  5 Term 08/10/11 [redacted]w[redacted]d  7 lb 4.2 oz (3.295 kg) F CS-LTranv Spinal  LIV  4 SAB 09/13/10          3 SAB 03/14/10          2 Term 03/14/08 [redacted]w[redacted]d   M VBAC EPI  LIV  1 Term 11/12/05 [redacted]w[redacted]d   M CS-Unspec EPI N LIV     The following portions of the patient's history were reviewed and updated as appropriate: allergies, current medications, past family history, past medical history, past social history, past surgical history, and problem list.  Review of Systems Pertinent items noted in HPI and remainder of comprehensive ROS otherwise negative.    Objective:     Vitals:   11/20/23 0807 11/20/23 0814  BP: (!) 150/97 133/89  Pulse: 92 84  Weight: 117 lb (53.1 kg)   Height: 5' 6 (1.676 m)    Vitals:  WNL General appearance: alert, cooperative and no distress  HEENT: Normocephalic, without obvious abnormality, atraumatic Eyes: negative Throat: lips, mucosa, and tongue normal; teeth and gums normal  Respiratory: Clear to auscultation bilaterally  CV: Regular rate and rhythm  Breasts:  Normal appearance, no masses or  tenderness, no nipple retraction or dimpling  GI: Soft, non-tender; bowel sounds normal; no masses,  no organomegaly  GU: External Genitalia:  Tanner V, no lesion Urethra:  No prolapse   Vagina: Pink, normal rugae, no blood or discharge  Cervix: No CMT, no lesion  Uterus:  Normal size and contour, non tender  Adnexa: Normal, no masses, Left adnexa mildly tender.   Musculoskeletal: No edema, redness or tenderness in the calves or thighs  Skin: No lesions or rash  Lymphatic: Axillary adenopathy: none     Psychiatric: Normal mood and behavior        Assessment:    Healthy female exam.    Plan:    Pap up to date Yearly mammograms at [facility] for osteoporosis; Maximize calcium and vitamin D ; weight bearing exercise; increase muscle mass as she approaches menopause.  OCPs--Will continue until 49 secondary to osteoporosis; will stop at 49. Pt having left lower quadrant pain--on OCPs so should not be ruptured cytst.  Bimanual --te pain is ovary ovary based on exam.

## 2023-11-21 ENCOUNTER — Encounter: Payer: Self-pay | Admitting: Obstetrics & Gynecology

## 2023-11-21 ENCOUNTER — Other Ambulatory Visit: Payer: Self-pay | Admitting: Obstetrics & Gynecology

## 2023-11-21 ENCOUNTER — Other Ambulatory Visit (HOSPITAL_COMMUNITY): Payer: Self-pay

## 2023-11-21 DIAGNOSIS — Z789 Other specified health status: Secondary | ICD-10-CM

## 2023-11-21 MED ORDER — LARIN 24 FE 1-20 MG-MCG(24) PO TABS
1.0000 | ORAL_TABLET | Freq: Every day | ORAL | 3 refills | Status: AC
Start: 2023-11-21 — End: ?

## 2023-11-27 ENCOUNTER — Ambulatory Visit: Payer: Self-pay | Admitting: Obstetrics & Gynecology
